# Patient Record
Sex: Female | Born: 1970 | Race: Black or African American | Hispanic: No | Smoking: Former smoker
Health system: Southern US, Community
[De-identification: ages and names within clinical notes are randomized; demographics above are authoritative.]

## PROBLEM LIST (undated history)

## (undated) DIAGNOSIS — E119 Type 2 diabetes mellitus without complications: Secondary | ICD-10-CM

## (undated) DIAGNOSIS — I10 Essential (primary) hypertension: Secondary | ICD-10-CM

## (undated) HISTORY — DX: Type 2 diabetes mellitus without complications: E11.9

## (undated) HISTORY — PX: TUBAL LIGATION: SHX77

## (undated) HISTORY — DX: Essential (primary) hypertension: I10

## (undated) HISTORY — PX: CHOLECYSTECTOMY: SHX55

---

## 2001-12-12 ENCOUNTER — Other Ambulatory Visit: Admission: RE | Admit: 2001-12-12 | Discharge: 2001-12-12 | Payer: Self-pay | Admitting: *Deleted

## 2001-12-23 ENCOUNTER — Encounter: Payer: Self-pay | Admitting: *Deleted

## 2001-12-23 ENCOUNTER — Ambulatory Visit (HOSPITAL_COMMUNITY): Admission: RE | Admit: 2001-12-23 | Discharge: 2001-12-23 | Payer: Self-pay | Admitting: *Deleted

## 2002-01-16 ENCOUNTER — Inpatient Hospital Stay (HOSPITAL_COMMUNITY): Admission: AD | Admit: 2002-01-16 | Discharge: 2002-01-18 | Payer: Self-pay | Admitting: *Deleted

## 2015-10-19 ENCOUNTER — Emergency Department (HOSPITAL_BASED_OUTPATIENT_CLINIC_OR_DEPARTMENT_OTHER)
Admission: EM | Admit: 2015-10-19 | Discharge: 2015-10-19 | Discharge status: Against Medical Advice | Payer: Medicaid Other | Attending: Emergency Medicine | Admitting: Emergency Medicine

## 2015-10-19 ENCOUNTER — Emergency Department (HOSPITAL_BASED_OUTPATIENT_CLINIC_OR_DEPARTMENT_OTHER): Payer: Medicaid Other

## 2015-10-19 ENCOUNTER — Encounter (HOSPITAL_BASED_OUTPATIENT_CLINIC_OR_DEPARTMENT_OTHER): Payer: Self-pay | Admitting: *Deleted

## 2015-10-19 DIAGNOSIS — F17210 Nicotine dependence, cigarettes, uncomplicated: Secondary | ICD-10-CM | POA: Insufficient documentation

## 2015-10-19 DIAGNOSIS — R05 Cough: Secondary | ICD-10-CM | POA: Insufficient documentation

## 2015-10-19 LAB — RAPID STREP SCREEN (MED CTR MEBANE ONLY): Streptococcus, Group A Screen (Direct): NEGATIVE

## 2015-10-19 NOTE — ED Notes (Signed)
Cough x 3 days

## 2015-10-20 ENCOUNTER — Emergency Department (HOSPITAL_BASED_OUTPATIENT_CLINIC_OR_DEPARTMENT_OTHER)
Admission: EM | Admit: 2015-10-20 | Discharge: 2015-10-20 | Disposition: A | Discharge status: Home / Self Care | Payer: Medicaid Other | Attending: Emergency Medicine | Admitting: Emergency Medicine

## 2015-10-20 ENCOUNTER — Encounter (HOSPITAL_BASED_OUTPATIENT_CLINIC_OR_DEPARTMENT_OTHER): Payer: Self-pay | Admitting: Emergency Medicine

## 2015-10-20 DIAGNOSIS — J209 Acute bronchitis, unspecified: Secondary | ICD-10-CM | POA: Insufficient documentation

## 2015-10-20 DIAGNOSIS — F17210 Nicotine dependence, cigarettes, uncomplicated: Secondary | ICD-10-CM | POA: Insufficient documentation

## 2015-10-20 DIAGNOSIS — J40 Bronchitis, not specified as acute or chronic: Secondary | ICD-10-CM

## 2015-10-20 DIAGNOSIS — R51 Headache: Secondary | ICD-10-CM | POA: Insufficient documentation

## 2015-10-20 MED ORDER — ALBUTEROL SULFATE HFA 108 (90 BASE) MCG/ACT IN AERS
1.0000 | INHALATION_SPRAY | RESPIRATORY_TRACT | Status: DC | PRN
  Administered 2015-10-20: 2 via RESPIRATORY_TRACT
  Filled 2015-10-20: qty 6.7

## 2015-10-20 MED ORDER — AZITHROMYCIN 250 MG PO TABS: ORAL_TABLET | ORAL | Status: DC

## 2015-10-20 MED ORDER — PREDNISONE 10 MG PO TABS: 20.0000 mg | ORAL_TABLET | Freq: Two times a day (BID) | ORAL | Status: DC

## 2015-10-20 MED ORDER — BENZONATATE 200 MG PO CAPS: 200.0000 mg | ORAL_CAPSULE | Freq: Three times a day (TID) | ORAL | Status: DC | PRN

## 2015-10-20 MED FILL — BENZONATATE 200 MG CAPSULE: 200 | 6 days supply | Qty: 20 | Fill #0

## 2015-10-20 MED FILL — predniSONE 10 MG TABS: 10 | 4 days supply | Qty: 16 | Fill #0

## 2015-10-20 MED FILL — AZITHROMYCIN 250 MG TABLET: 250 | 5 days supply | Qty: 6 | Fill #0

## 2015-10-20 NOTE — ED Notes (Signed)
RT gave patient MDI to take home. She stated she had taken them before and had no questions. She said she did not need it this moment but would need it later.

## 2015-10-20 NOTE — ED Notes (Signed)
Cough x 2-3 days

## 2015-10-20 NOTE — Discharge Instructions (Signed)
Follow up with your doctor to have your blood pressure rechecked. It was elevated today 145/108

## 2015-10-20 NOTE — ED Provider Notes (Signed)
CSN: [id number]     Arrival date & time 10/20/15  1008 History   First MD Initiated Contact with Patient 10/20/15 1221     Chief Complaint  Patient presents with  . Cough     (Consider location/radiation/quality/duration/timing/severity/associated sxs/prior Treatment) Patient is a 45 y.o. female presenting with cough.  Cough Cough characteristics:  Productive Sputum characteristics:  Yellow Severity:  Moderate Onset quality:  Gradual Duration:  3 days Timing:  Intermittent Progression:  Worsening Chronicity:  New Smoker: yes   Context: sick contacts   Relieved by:  Nothing Worsened by:  Lying down and smoking Ineffective treatments:  Fluids and cough suppressants Associated symptoms: chills, fever, headaches, myalgias and sinus congestion   Associated symptoms: no ear pain and no rash    Allison Poole is a 45 y.o. female who presents to the ED with cough, sore throat and congestion that started 3 days ago. She reports that her son was diagnosed with bronchitis and then was called that his strep screen was positive. Patient also reports that parents have been sick.   History reviewed. No pertinent past medical history. Past Surgical History  Procedure Laterality Date  . Cholecystectomy     History reviewed. No pertinent family history. Social History  Substance Use Topics  . Smoking status: Current Every Day Smoker -- 0.50 packs/day    Types: Cigarettes  . Smokeless tobacco: None  . Alcohol Use: Yes   OB History    No data available     Review of Systems  Constitutional: Positive for fever and chills.  HENT: Positive for congestion and sinus pressure. Negative for ear pain and mouth sores.   Eyes: Negative for pain, redness and itching.  Respiratory: Positive for cough.   Gastrointestinal: Negative for nausea, vomiting and abdominal pain.  Genitourinary: Negative for dysuria, urgency and hematuria.  Musculoskeletal: Positive for myalgias. Negative for back pain  and neck stiffness.  Skin: Negative for rash.  Neurological: Positive for headaches. Negative for dizziness and syncope.      Allergies  Review of patient's allergies indicates no known allergies.  Home Medications   Prior to Admission medications   Medication Sig Start Date End Date Taking? Authorizing Provider  azithromycin (ZITHROMAX Z-PAK) 250 MG tablet Take 2 tablets today PO then one tablet daily 10/20/15   Hope Orlene Och, NP  benzonatate (TESSALON) 200 MG capsule Take 1 capsule (200 mg total) by mouth 3 (three) times daily as needed for cough. 10/20/15   Hope Orlene Och, NP  predniSONE (DELTASONE) 10 MG tablet Take 2 tablets (20 mg total) by mouth 2 (two) times daily with a meal. 10/20/15   Hope Orlene Och, NP   BP 145/108 mmHg  Pulse 93  Temp(Src) 98.6 F (37 C) (Oral)  Resp 18  Wt 113.399 kg  SpO2 100%  LMP 10/12/2015 Physical Exam  Constitutional: She is oriented to person, place, and time. She appears well-developed and well-nourished. She appears distressed.  HENT:  Head: Normocephalic.  Right Ear: Tympanic membrane normal.  Left Ear: Tympanic membrane normal.  Nose: Rhinorrhea present.  Mouth/Throat: Uvula is midline and mucous membranes are normal. Posterior oropharyngeal erythema present.  Eyes: Conjunctivae and EOM are normal. Pupils are equal, round, and reactive to light.  Neck: Normal range of motion. Neck supple.  Cardiovascular: Normal rate and regular rhythm.   Elevated BP  Pulmonary/Chest: Effort normal. No respiratory distress. Wheezes: occasional.  Abdominal: She exhibits no distension.  Musculoskeletal: Normal range of motion.  Neurological: She  is alert and oriented to person, place, and time. No cranial nerve deficit.  Skin: Skin is warm and dry.  Psychiatric: She has a normal mood and affect. Her behavior is normal.  Nursing note and vitals reviewed.   ED Course  Procedures (including critical care time) Labs Review Labs Reviewed - No data to  display  Imaging Review Dg Chest 2 View  10/19/2015  CLINICAL DATA:  Cough for 2 months with worsening last 2 days EXAM: CHEST  2 VIEW COMPARISON:  None. FINDINGS: Cardiomediastinal silhouette is unremarkable. No acute infiltrate or pleural effusion. No pulmonary edema. Bony thorax is unremarkable. IMPRESSION: No active cardiopulmonary disease. Electronically Signed   By: Natasha MeadLiviu  Pop M.D.   On: 10/19/2015 19:56     MDM  45 y.o. female with productive cough and congestion x3 days stable for d/c without respiratory distress, O2 SAT 100% on R/A. Will treat for bronchitis and encouraged patient to stop smoking. Albuterol inhaler with instructions given to patient prior to d/c. Will start short steroid burst and cough medication in addition to Z-Pak. Patient to follow up with her PCP or return for worsening symptoms.   Final diagnoses:  Bronchitis       Janne NapoleonHope M Neese, NP 10/20/15 1343  Lavera Guiseana Duo Liu, MD 10/20/15 2016

## 2015-10-22 LAB — CULTURE, GROUP A STREP: Strep A Culture: NEGATIVE

## 2017-01-31 ENCOUNTER — Encounter (HOSPITAL_BASED_OUTPATIENT_CLINIC_OR_DEPARTMENT_OTHER): Payer: Self-pay | Admitting: Emergency Medicine

## 2017-01-31 ENCOUNTER — Emergency Department (HOSPITAL_BASED_OUTPATIENT_CLINIC_OR_DEPARTMENT_OTHER)
Admission: EM | Admit: 2017-01-31 | Discharge: 2017-01-31 | Disposition: A | Discharge status: Home / Self Care | Payer: Medicaid Other | Attending: Emergency Medicine | Admitting: Emergency Medicine

## 2017-01-31 DIAGNOSIS — F17210 Nicotine dependence, cigarettes, uncomplicated: Secondary | ICD-10-CM | POA: Insufficient documentation

## 2017-01-31 DIAGNOSIS — B9789 Other viral agents as the cause of diseases classified elsewhere: Secondary | ICD-10-CM

## 2017-01-31 DIAGNOSIS — J069 Acute upper respiratory infection, unspecified: Secondary | ICD-10-CM | POA: Diagnosis not present

## 2017-01-31 DIAGNOSIS — R05 Cough: Secondary | ICD-10-CM | POA: Diagnosis present

## 2017-01-31 MED ORDER — BENZONATATE 200 MG PO CAPS: 200.0000 mg | ORAL_CAPSULE | Freq: Three times a day (TID) | ORAL | 0 refills | Status: DC | PRN

## 2017-01-31 MED ORDER — LORATADINE 10 MG PO TABS: 10.0000 mg | ORAL_TABLET | Freq: Every day | ORAL | 0 refills | Status: DC

## 2017-01-31 MED ORDER — FLUTICASONE PROPIONATE 50 MCG/ACT NA SUSP: 1.0000 | Freq: Every day | NASAL | 2 refills | Status: DC

## 2017-01-31 MED FILL — FLUTICASONE PROP 50 MCG SPR: 50 | 30 days supply | Qty: 16 | Fill #0

## 2017-01-31 MED FILL — LORATADINE 10 MG TABLET: 10 | 100 days supply | Qty: 100 | Fill #0

## 2017-01-31 MED FILL — BENZONATATE 200 MG CAP: 200 | 6 days supply | Qty: 20 | Fill #0

## 2017-01-31 NOTE — ED Provider Notes (Signed)
[facility]   CSN: [id number] Arrival date & time: 01/31/17  8295     History   Chief Complaint Chief Complaint  Patient presents with  . Cough    HPI Allison Poole is a 46 y.o. female.  HPI  46 year old female presents today with upper respiratory congestion.  Patient reports symptoms started 3 days ago with sinus pressure, rhinorrhea, congestion and minimally active cough.  Patient denies any fevers at home, denies any nausea or vomiting, or shortness of breath.  Patient reports a history of regional allergies not currently taking any medications.    History reviewed. No pertinent past medical history.  There are no active problems to display for this patient.   Past Surgical History:  Procedure Laterality Date  . CHOLECYSTECTOMY    . TUBAL LIGATION      OB History    No data available      Home Medications    Prior to Admission medications   Medication Sig Start Date End Date Taking? Authorizing Provider  azithromycin (ZITHROMAX Z-PAK) 250 MG tablet Take 2 tablets today PO then one tablet daily 10/20/15   Hope Orlene Och, NP  benzonatate (TESSALON) 200 MG capsule Take 1 capsule (200 mg total) by mouth 3 (three) times daily as needed for cough. 01/31/17   Eyvonne Mechanic, PA-C  fluticasone (FLONASE) 50 MCG/ACT nasal spray Place 1 spray into both nostrils daily. 01/31/17   Eyvonne Mechanic, PA-C  loratadine (CLARITIN) 10 MG tablet Take 1 tablet (10 mg total) by mouth daily. 01/31/17   Eyvonne Mechanic, PA-C  predniSONE (DELTASONE) 10 MG tablet Take 2 tablets (20 mg total) by mouth 2 (two) times daily with a meal. 10/20/15   Hope Orlene Och, NP    Family History No family history on file.  Social History Social History  Substance Use Topics  . Smoking status: Current Every Day Smoker    Packs/day: 0.50    Types: E-cigarettes  . Smokeless tobacco: Never Used  . Alcohol use Yes     Allergies   Patient has no known allergies.   Review of  Systems Review of Systems  Constitutional: Negative for fever.  HENT: Positive for congestion, postnasal drip and sinus pressure. Negative for ear discharge, ear pain, nosebleeds and trouble swallowing.   Respiratory: Positive for cough. Negative for chest tightness and shortness of breath.   All other systems reviewed and are negative.    Physical Exam Updated Vital Signs BP (!) 144/95 (BP Location: Left Arm)   Pulse 72   Temp 97.9 F (36.6 C) (Oral)   Resp 16   Ht  (1.6 m)   Wt 122.5 kg   LMP 01/25/2017   SpO2 99%   BMI 47.83 kg/m   Physical Exam  Constitutional: She is oriented to person, place, and time. She appears well-developed and well-nourished.  HENT:  Head: Normocephalic and atraumatic.  Right Ear: Tympanic membrane normal.  Nose: Mucosal edema and rhinorrhea present. No epistaxis.  Left cerumen impaction  Eyes: Conjunctivae are normal. Pupils are equal, round, and reactive to light. Right eye exhibits no discharge. Left eye exhibits no discharge. No scleral icterus.  Neck: Normal range of motion. No JVD present. No tracheal deviation present.  Pulmonary/Chest: Effort normal. No stridor. No respiratory distress. She has no wheezes. She has no rales.  Neurological: She is alert and oriented to person, place, and time. Coordination normal.  Psychiatric: She has a normal mood and affect. Her behavior is  normal. Judgment and thought content normal.  Nursing note and vitals reviewed.    ED Treatments / Results  Labs (all labs ordered are listed, but only abnormal results are displayed) Labs Reviewed - No data to display  EKG  EKG Interpretation None       Radiology No results found.  Procedures Procedures (including critical care time)  Medications Ordered in ED Medications - No data to display   Initial Impression / Assessment and Plan / ED Course  I have reviewed the triage vital signs and the nursing notes.  Pertinent labs & imaging  results that were available during my care of the patient were reviewed by me and considered in my medical decision making (see chart for details).      Final Clinical Impressions(s) / ED Diagnoses   Final diagnoses:  Viral URI with cough     Discharge Meds: Flonase, Tessalon, Claritin  Assessment/Plan: 89 YOF presents today with likely viral URI.  Patient is afebrile nontoxic, clear lung sounds.  I have very low suspicion for significant bacterial infection.  Patient symptoms more pronounced in the sinuses.  No concerning features for bacterial sinusitis.  Patient will be started on above medications, encouraged follow-up with primary care return immediately if any new or worsening signs or symptoms present.  She verbalized understanding and agreement to today's plan.     New Prescriptions New Prescriptions   FLUTICASONE (FLONASE) 50 MCG/ACT NASAL SPRAY    Place 1 spray into both nostrils daily.   LORATADINE (CLARITIN) 10 MG TABLET    Take 1 tablet (10 mg total) by mouth daily.     Eyvonne Mechanic, PA-C 01/31/17 1021    Melene Plan, DO 01/31/17 1119

## 2017-01-31 NOTE — ED Triage Notes (Signed)
Cough, congestion, sore throat since Monday. OTC meds not working. Pt reports brown sputum.

## 2017-01-31 NOTE — Discharge Instructions (Signed)
Please read attached information. If you experience any new or worsening signs or symptoms please return to the emergency room for evaluation. Please follow-up with your primary care provider or specialist as discussed. Please use medication prescribed only as directed and discontinue taking if you have any concerning signs or symptoms.   °

## 2017-03-07 ENCOUNTER — Emergency Department (HOSPITAL_BASED_OUTPATIENT_CLINIC_OR_DEPARTMENT_OTHER)
Admission: EM | Admit: 2017-03-07 | Discharge: 2017-03-07 | Disposition: A | Discharge status: Home / Self Care | Payer: Medicaid Other | Attending: Emergency Medicine | Admitting: Emergency Medicine

## 2017-03-07 ENCOUNTER — Encounter (HOSPITAL_BASED_OUTPATIENT_CLINIC_OR_DEPARTMENT_OTHER): Payer: Self-pay

## 2017-03-07 DIAGNOSIS — M545 Low back pain, unspecified: Secondary | ICD-10-CM

## 2017-03-07 DIAGNOSIS — F17210 Nicotine dependence, cigarettes, uncomplicated: Secondary | ICD-10-CM | POA: Diagnosis not present

## 2017-03-07 LAB — URINALYSIS, ROUTINE W REFLEX MICROSCOPIC
BILIRUBIN URINE: NEGATIVE
GLUCOSE, UA: NEGATIVE mg/dL
HGB URINE DIPSTICK: NEGATIVE
KETONES UR: NEGATIVE mg/dL
Leukocytes, UA: NEGATIVE
NITRITE: NEGATIVE
PH: 7 (ref 5.0–8.0)
Protein, ur: NEGATIVE mg/dL
SPECIFIC GRAVITY, URINE: 1.018 (ref 1.005–1.030)

## 2017-03-07 LAB — PREGNANCY, URINE: Preg Test, Ur: NEGATIVE

## 2017-03-07 MED ORDER — NAPROXEN 500 MG PO TABS: 500.0000 mg | ORAL_TABLET | Freq: Two times a day (BID) | ORAL | 0 refills | Status: DC

## 2017-03-07 MED ORDER — METHOCARBAMOL 500 MG PO TABS: 1000.0000 mg | ORAL_TABLET | Freq: Four times a day (QID) | ORAL | 0 refills | Status: DC

## 2017-03-07 MED FILL — NAPROXEN 500 MG TABLET: 500 | 10 days supply | Qty: 20 | Fill #0

## 2017-03-07 MED FILL — METHOCARBAMOL 500 MG TABLET: 500 | 3 days supply | Qty: 20 | Fill #0

## 2017-03-07 NOTE — ED Notes (Signed)
Pt given note for work. Directed to pharmacy to pick up Rx.

## 2017-03-07 NOTE — ED Triage Notes (Signed)
C/o lower back pain x 2 days-denies injury-also c/o bilat LE swelling x 2 weeks-NAD-slow steady gait

## 2017-03-07 NOTE — ED Provider Notes (Signed)
[facility]   CSN: [id number] Arrival date & time: 03/07/17  1140     History   Chief Complaint Chief Complaint  Patient presents with  . Back Pain    HPI Allison Poole is a 46 y.o. female.  Patient complains of bilateral lower back pain starting 2 days ago. No acute injuries. Pain is worse with movement and palpation. Patient states that she has been exercising but has not done so in the past 1 week. She took a friend's muscle relaxer yesterday with some relief. She has also taken ibuprofen. No radiation of pain into her legs. No fevers, abdominal pain, urinary symptoms, vaginal bleeding or discharge. Patient denies warning symptoms of back pain including: fecal incontinence, urinary retention or overflow incontinence, night sweats, waking from sleep with back pain, unexplained fevers or weight loss, h/o cancer, IVDU, recent trauma.    Patient also notes bilateral lower extremity swelling for the past 2 weeks. She works sitting. Swelling is worse at the end of the day and better in the mornings after she elevates them. No shortness of breath.      History reviewed. No pertinent past medical history.  There are no active problems to display for this patient.   Past Surgical History:  Procedure Laterality Date  . CHOLECYSTECTOMY    . TUBAL LIGATION      OB History    No data available       Home Medications    Prior to Admission medications   Medication Sig Start Date End Date Taking? Authorizing Provider  methocarbamol (ROBAXIN) 500 MG tablet Take 2 tablets (1,000 mg total) by mouth 4 (four) times daily. 03/07/17   Renne Crigler, PA-C  naproxen (NAPROSYN) 500 MG tablet Take 1 tablet (500 mg total) by mouth 2 (two) times daily. 03/07/17   Renne Crigler, PA-C    Family History No family history on file.  Social History Social History  Substance Use Topics  . Smoking status: Current Every Day Smoker    Packs/day: 0.50    Types:  E-cigarettes  . Smokeless tobacco: Never Used  . Alcohol use Yes     Comment: weekly     Allergies   Patient has no known allergies.   Review of Systems Review of Systems  Constitutional: Negative for fever and unexpected weight change.  Cardiovascular: Negative for leg swelling.  Gastrointestinal: Negative for constipation.       Negative for fecal incontinence.   Genitourinary: Negative for dysuria, flank pain, hematuria, pelvic pain, vaginal bleeding and vaginal discharge.       Negative for urinary incontinence or retention.  Musculoskeletal: Positive for back pain.  Neurological: Negative for weakness and numbness.       Denies saddle paresthesias.     Physical Exam Updated Vital Signs BP (!) 144/93 (BP Location: Left Arm)   Pulse 94   Temp 99.2 F (37.3 C) (Oral)   Resp 18   Ht 5\' 3"  (1.6 m)   Wt 127 kg (280 lb)   LMP 02/22/2017   SpO2 98%   BMI 49.60 kg/m   Physical Exam  Constitutional: She appears well-developed and well-nourished.  HENT:  Head: Normocephalic and atraumatic.  Eyes: Conjunctivae are normal.  Neck: Normal range of motion. Neck supple.  Pulmonary/Chest: Effort normal.  Abdominal: Soft. There is no tenderness. There is no CVA tenderness.  Musculoskeletal: Normal range of motion.       Cervical back: She exhibits normal range of motion,  no tenderness and no bony tenderness.       Thoracic back: She exhibits normal range of motion, no tenderness and no bony tenderness.       Lumbar back: She exhibits tenderness.       Back:  No step-off noted with palpation of spine.   Neurological: She is alert. She has normal strength and normal reflexes. No sensory deficit.  5/5 strength in entire lower extremities bilaterally. No sensation deficit. Patient ambulatory without foot drop.  Skin: Skin is warm and dry. No rash noted.  Trace bilateral edema of feet and ankles.  Psychiatric: She has a normal mood and affect.  Nursing note and vitals  reviewed.    ED Treatments / Results  Labs (all labs ordered are listed, but only abnormal results are displayed) Labs Reviewed  URINALYSIS, ROUTINE W REFLEX MICROSCOPIC  PREGNANCY, URINE    Procedures Procedures (including critical care time)  Medications Ordered in ED Medications - No data to display   Initial Impression / Assessment and Plan / ED Course  I have reviewed the triage vital signs and the nursing notes.  Pertinent labs & imaging results that were available during my care of the patient were reviewed by me and considered in my medical decision making (see chart for details).     1:02 PM Patient seen and examined. Informed of negative urine test results.  Vital signs reviewed and are as follows: Vitals:   03/07/17 1150  BP: (!) 144/93  Pulse: 94  Resp: 18  Temp: 99.2 F (37.3 C)    No red flag s/s of low back pain. Patient was counseled on back pain precautions and told to do activity as tolerated but do not lift, push, or pull heavy objects more than 10 pounds for the next week. Patient counseled to use ice or heat on back for no longer than 15 minutes every hour.   Patient counseled on proper use of muscle relaxant medication. They were told not to drink alcohol, drive any vehicle, or do any dangerous activities while taking this medication. Patient verbalized understanding.  Patient urged to follow-up with PCP if pain does not improve with treatment and rest or if pain becomes recurrent. Urged to return with worsening severe pain, loss of bowel or bladder control, trouble walking.   The patient verbalizes understanding and agrees with the plan.    Final Clinical Impressions(s) / ED Diagnoses   Final diagnoses:  Acute bilateral low back pain without sciatica   Patient with back pain, Worse with movement and palpation. No neurological deficits. Patient is ambulatory. No warning symptoms of back pain including: fecal incontinence, urinary retention or  overflow incontinence, night sweats, waking from sleep with back pain, unexplained fevers or weight loss, h/o cancer, IVDU, recent trauma. No concern for cauda equina, epidural abscess, or other serious cause of back pain. Conservative measures such as rest, ice/heat and pain medicine indicated with PCP follow-up if no improvement with conservative management.    New Prescriptions New Prescriptions   METHOCARBAMOL (ROBAXIN) 500 MG TABLET    Take 2 tablets (1,000 mg total) by mouth 4 (four) times daily.   NAPROXEN (NAPROSYN) 500 MG TABLET    Take 1 tablet (500 mg total) by mouth 2 (two) times daily.     Renne CriglerGeiple, Akanksha Bellmore, PA-C 03/07/17 1303    Renne CriglerGeiple, Argenis Kumari, PA-C 03/07/17 1304    Gwyneth SproutPlunkett, Whitney, MD 03/07/17 1406

## 2017-03-07 NOTE — Discharge Instructions (Signed)
Please read and follow all provided instructions.  Your diagnoses today include:  1. Acute bilateral low back pain without sciatica     Tests performed today include:  Vital signs - see below for your results today  Urine test - no infection  Medications prescribed:   Robaxin (methocarbamol) - muscle relaxer medication  DO NOT drive or perform any activities that require you to be awake and alert because this medicine can make you drowsy.    Naproxen - anti-inflammatory pain medication  Do not exceed 500mg  naproxen every 12 hours, take with food  You have been prescribed an anti-inflammatory medication or NSAID. Take with food. Take smallest effective dose for the shortest duration needed for your pain. Stop taking if you experience stomach pain or vomiting.   Take any prescribed medications only as directed.  Home care instructions:   Follow any educational materials contained in this packet  Please rest, use ice or heat on your back for the next several days  Do not lift, push, pull anything more than 10 pounds for the next week  Follow-up instructions: Please follow-up with your primary care provider in the next 1 week for further evaluation of your symptoms.   Return instructions:  SEEK IMMEDIATE MEDICAL ATTENTION IF YOU HAVE:  New numbness, tingling, weakness, or problem with the use of your arms or legs  Severe back pain not relieved with medications  Loss control of your bowels or bladder  Increasing pain in any areas of the body (such as chest or abdominal pain)  Shortness of breath, dizziness, or fainting.   Worsening nausea (feeling sick to your stomach), vomiting, fever, or sweats  Any other emergent concerns regarding your health   Additional Information:  Your vital signs today were: BP (!) 144/93 (BP Location: Left Arm)    Pulse 94    Temp 99.2 F (37.3 C) (Oral)    Resp 18    Ht 5\' 3"  (1.6 m)    Wt 127 kg (280 lb)    LMP 02/22/2017    SpO2 98%     BMI 49.60 kg/m  If your blood pressure (BP) was elevated above 135/85 this visit, please have this repeated by your doctor within one month. --------------

## 2019-06-19 ENCOUNTER — Emergency Department (HOSPITAL_BASED_OUTPATIENT_CLINIC_OR_DEPARTMENT_OTHER)
Admission: EM | Admit: 2019-06-19 | Discharge: 2019-06-19 | Disposition: A | Discharge status: Home / Self Care | Payer: Self-pay

## 2022-01-04 ENCOUNTER — Emergency Department (HOSPITAL_BASED_OUTPATIENT_CLINIC_OR_DEPARTMENT_OTHER)
Admission: EM | Admit: 2022-01-04 | Discharge: 2022-01-04 | Disposition: A | Discharge status: Home / Self Care | Payer: Medicaid Other | Attending: Emergency Medicine | Admitting: Emergency Medicine

## 2022-01-04 ENCOUNTER — Encounter (HOSPITAL_BASED_OUTPATIENT_CLINIC_OR_DEPARTMENT_OTHER): Payer: Self-pay

## 2022-01-04 ENCOUNTER — Other Ambulatory Visit: Payer: Self-pay

## 2022-01-04 DIAGNOSIS — Z20822 Contact with and (suspected) exposure to covid-19: Secondary | ICD-10-CM | POA: Insufficient documentation

## 2022-01-04 DIAGNOSIS — J029 Acute pharyngitis, unspecified: Secondary | ICD-10-CM

## 2022-01-04 DIAGNOSIS — J069 Acute upper respiratory infection, unspecified: Secondary | ICD-10-CM | POA: Insufficient documentation

## 2022-01-04 DIAGNOSIS — B9789 Other viral agents as the cause of diseases classified elsewhere: Secondary | ICD-10-CM | POA: Insufficient documentation

## 2022-01-04 LAB — RESP PANEL BY RT-PCR (FLU A&B, COVID) ARPGX2
Influenza A by PCR: NEGATIVE
Influenza B by PCR: NEGATIVE
SARS Coronavirus 2 by RT PCR: NEGATIVE

## 2022-01-04 LAB — GROUP A STREP BY PCR: Group A Strep by PCR: NOT DETECTED

## 2022-01-04 MED ORDER — LIDOCAINE VISCOUS HCL 2 % MT SOLN: 15.0000 mL | OROMUCOSAL | 0 refills | Status: DC | PRN

## 2022-01-04 NOTE — ED Triage Notes (Signed)
Pt c/o flu like sx x 4-5 days-NAD-steady gait 

## 2022-01-04 NOTE — ED Provider Notes (Signed)
?[facility] EMERGENCY DEPARTMENT ?Provider Note ? ? ?CSN: [id number] ?Arrival date & time: 01/04/22  1739 ? ?  ? ?History ? ?Chief Complaint  ?Patient presents with  ? Cough  ? ? ?Allison Poole is a 51 y.o. female presenting to the ED with 4-day history of sore throat, congestion and slight cough.  She has been taking Tylenol with some improvement in her symptoms.  Last use was this morning.  Denies any sick contacts with similar symptoms.  Denies any chest pain, shortness of breath, trismus or drooling. ? ? ?Cough ?Associated symptoms: sore throat   ?Associated symptoms: no chills and no fever   ? ?  ? ?Home Medications ?Prior to Admission medications   ?Medication Sig Start Date End Date Taking? Authorizing Provider  ?lidocaine (XYLOCAINE) 2 % solution Use as directed 15 mLs in the mouth or throat as needed for mouth pain. 01/04/22  Yes Jayston Trevino, PA-C  ?methocarbamol (ROBAXIN) 500 MG tablet Take 2 tablets (1,000 mg total) by mouth 4 (four) times daily. 03/07/17   Renne Crigler, PA-C  ?naproxen (NAPROSYN) 500 MG tablet Take 1 tablet (500 mg total) by mouth 2 (two) times daily. 03/07/17   Renne Crigler, PA-C  ?   ? ?Allergies    ?Patient has no known allergies.   ? ?Review of Systems   ?Review of Systems  ?Constitutional:  Negative for chills and fever.  ?HENT:  Positive for congestion and sore throat.   ?Respiratory:  Positive for cough.   ? ?Physical Exam ?Updated Vital Signs ?BP (!) 178/91 (BP Location: Right Arm)   Pulse 72   Temp 99 ?F (37.2 ?C) (Oral)   Resp 17   Ht 5\' 3"  (1.6 m)   Wt 124.7 kg   LMP 01/01/2022   SpO2 100%   BMI 48.71 kg/m?  ?Physical Exam ?Vitals and nursing note reviewed.  ?Constitutional:   ?   General: She is not in acute distress. ?   Appearance: She is well-developed. She is not diaphoretic.  ?HENT:  ?   Head: Normocephalic and atraumatic.  ?   Mouth/Throat:  ?   Pharynx: Uvula midline. Posterior oropharyngeal erythema present.  ?   Tonsils: 1+ on the right. 1+ on the  left.  ?   Comments: Bilaterally, symmetrically enlarged tonsils without exudates. Patient does not appear to be in acute distress. No trismus or drooling present. No pooling of secretions. Patient is tolerating secretions and is not in respiratory distress. No neck pain or tenderness to palpation of the neck. Full active and passive range of motion of the neck. No evidence of RPA or PTA. ?Eyes:  ?   General: No scleral icterus. ?   Conjunctiva/sclera: Conjunctivae normal.  ?Cardiovascular:  ?   Rate and Rhythm: Normal rate and regular rhythm.  ?   Heart sounds: Normal heart sounds.  ?Pulmonary:  ?   Effort: Pulmonary effort is normal. No respiratory distress.  ?   Breath sounds: Normal breath sounds. No wheezing.  ?Musculoskeletal:  ?   Cervical back: Normal range of motion.  ?Skin: ?   Findings: No rash.  ?Neurological:  ?   Mental Status: She is alert.  ? ? ?ED Results / Procedures / Treatments   ?Labs ?(all labs ordered are listed, but only abnormal results are displayed) ?Labs Reviewed  ?RESP PANEL BY RT-PCR (FLU A&B, COVID) [id number]  ?GROUP A STREP BY PCR  ? ? ?EKG ?None ? ?Radiology ?No results found. ? ?Procedures ?Procedures  ? ? ?  Medications Ordered in ED ?Medications - No data to display ? ?ED Course/ Medical Decision Making/ A&P ?Clinical Course as of 01/04/22 1912  ?Wed Jan 04, 2022  ?1830 Resp Panel by RT-PCR (Flu A&B, Covid) Nasopharyngeal Swab ?Negative. [HK]  ?  ?Clinical Course User Index ?[HK] Idelle Leech, Burak Zerbe, PA-C  ? ?                        ?Medical Decision Making ?Amount and/or Complexity of Data Reviewed ?Labs:  Decision-making details documented in ED Course. ? ? ?Pt rapid strep test negative. Pt is tolerating secretions, not in respiratory distress, no neck pain, no trismus. Presentation not concerning for peritonsillar abscess or spread of infection to deep spaces of the throat; patent airway. Pt will be discharged with lidocaine to swish and spit. Ibuprofen or Tylenol as needed for pain/fever.  Specific return precautions discussed. Recommended PCP follow up. Pt appears safe for discharge.  ? ? ?Patient is hemodynamically stable, in NAD, and able to ambulate in the ED. Evaluation does not show pathology that would require ongoing emergent intervention or inpatient treatment. I explained the diagnosis to the patient. Pain has been managed and has no complaints prior to discharge. Patient is comfortable with above plan and is stable for discharge at this time. All questions were answered prior to disposition. Strict return precautions for returning to the ED were discussed. Encouraged follow up with PCP.  ? ?An After Visit Summary was printed and given to the patient. ? ? ?Portions of this note were generated with Scientist, clinical (histocompatibility and immunogenetics). Dictation errors may occur despite best attempts at proofreading. ? ? ? ? ? ? ? ? ?Final Clinical Impression(s) / ED Diagnoses ?Final diagnoses:  ?Viral URI with cough  ?Viral pharyngitis  ? ? ?Rx / DC Orders ?ED Discharge Orders   ? ?      Ordered  ?  lidocaine (XYLOCAINE) 2 % solution  As needed       ? 01/04/22 1910  ? ?  ?  ? ?  ? ? ?  Dietrich Pates, PA-C ?01/04/22 1912 ? ?  ?Melene Plan, DO ?01/04/22 1915 ? ?

## 2022-01-04 NOTE — Discharge Instructions (Addendum)
Your strep test was negative today. ?You can swish and spit lidocaine to help with throat discomfort. ?Follow-up with your primary care provider or the 1 listed below. ?Return to the ER if you start to experience worsening symptoms, trouble breathing, trouble swallowing, chest pain. ?

## 2022-01-09 ENCOUNTER — Other Ambulatory Visit: Payer: Self-pay

## 2022-01-09 ENCOUNTER — Encounter (HOSPITAL_BASED_OUTPATIENT_CLINIC_OR_DEPARTMENT_OTHER): Payer: Self-pay

## 2022-01-09 ENCOUNTER — Emergency Department (HOSPITAL_BASED_OUTPATIENT_CLINIC_OR_DEPARTMENT_OTHER): Payer: Self-pay

## 2022-01-09 ENCOUNTER — Inpatient Hospital Stay (HOSPITAL_BASED_OUTPATIENT_CLINIC_OR_DEPARTMENT_OTHER)
Admission: EM | Admit: 2022-01-09 | Discharge: 2022-01-12 | DRG: 153 | Disposition: A | Discharge status: Home / Self Care | Payer: Self-pay | Attending: Family Medicine | Admitting: Family Medicine

## 2022-01-09 DIAGNOSIS — J0430 Supraglottitis, unspecified, without obstruction: Principal | ICD-10-CM | POA: Diagnosis present

## 2022-01-09 DIAGNOSIS — Z8249 Family history of ischemic heart disease and other diseases of the circulatory system: Secondary | ICD-10-CM

## 2022-01-09 DIAGNOSIS — F17210 Nicotine dependence, cigarettes, uncomplicated: Secondary | ICD-10-CM | POA: Diagnosis present

## 2022-01-09 DIAGNOSIS — E1165 Type 2 diabetes mellitus with hyperglycemia: Secondary | ICD-10-CM | POA: Diagnosis present

## 2022-01-09 DIAGNOSIS — R739 Hyperglycemia, unspecified: Secondary | ICD-10-CM | POA: Diagnosis present

## 2022-01-09 DIAGNOSIS — F17200 Nicotine dependence, unspecified, uncomplicated: Secondary | ICD-10-CM | POA: Diagnosis present

## 2022-01-09 DIAGNOSIS — Z79899 Other long term (current) drug therapy: Secondary | ICD-10-CM

## 2022-01-09 DIAGNOSIS — Z6841 Body Mass Index (BMI) 40.0 and over, adult: Secondary | ICD-10-CM

## 2022-01-09 DIAGNOSIS — I10 Essential (primary) hypertension: Secondary | ICD-10-CM | POA: Diagnosis present

## 2022-01-09 LAB — CBC WITH DIFFERENTIAL/PLATELET
Abs Immature Granulocytes: 0.01 10*3/uL (ref 0.00–0.07)
Basophils Absolute: 0 10*3/uL (ref 0.0–0.1)
Basophils Relative: 0 %
Eosinophils Absolute: 0.1 10*3/uL (ref 0.0–0.5)
Eosinophils Relative: 2 %
HCT: 38.7 % (ref 36.0–46.0)
Hemoglobin: 12.5 g/dL (ref 12.0–15.0)
Immature Granulocytes: 0 %
Lymphocytes Relative: 36 %
Lymphs Abs: 2.2 10*3/uL (ref 0.7–4.0)
MCH: 27.5 pg (ref 26.0–34.0)
MCHC: 32.3 g/dL (ref 30.0–36.0)
MCV: 85.2 fL (ref 80.0–100.0)
Monocytes Absolute: 0.5 10*3/uL (ref 0.1–1.0)
Monocytes Relative: 9 %
Neutro Abs: 3.3 10*3/uL (ref 1.7–7.7)
Neutrophils Relative %: 53 %
Platelets: 344 10*3/uL (ref 150–400)
RBC: 4.54 MIL/uL (ref 3.87–5.11)
RDW: 14.2 % (ref 11.5–15.5)
WBC: 6.2 10*3/uL (ref 4.0–10.5)
nRBC: 0 % (ref 0.0–0.2)

## 2022-01-09 LAB — BASIC METABOLIC PANEL
Anion gap: 8 (ref 5–15)
BUN: <5 mg/dL — ABNORMAL LOW (ref 6–20)
CO2: 26 mmol/L (ref 22–32)
Calcium: 9.1 mg/dL (ref 8.9–10.3)
Chloride: 102 mmol/L (ref 98–111)
Creatinine, Ser: 0.86 mg/dL (ref 0.44–1.00)
GFR, Estimated: >60 mL/min (ref >60)
Glucose, Bld: 131 mg/dL — ABNORMAL HIGH (ref 70–99)
Potassium: 3.8 mmol/L (ref 3.5–5.1)
Sodium: 136 mmol/L (ref 135–145)

## 2022-01-09 MED ORDER — IOHEXOL 300 MG/ML  SOLN
75.0000 mL | Freq: Once | INTRAMUSCULAR | Status: AC | PRN
  Administered 2022-01-09: 75 mL via INTRAVENOUS

## 2022-01-09 MED ORDER — DEXAMETHASONE SODIUM PHOSPHATE 10 MG/ML IJ SOLN
10.0000 mg | Freq: Once | INTRAMUSCULAR | Status: AC
  Administered 2022-01-09: 10 mg via INTRAVENOUS
  Filled 2022-01-09: qty 1

## 2022-01-09 MED ORDER — SODIUM CHLORIDE 0.9 % IV SOLN
3.0000 g | Freq: Once | INTRAVENOUS | Status: AC
  Administered 2022-01-09: 3 g via INTRAVENOUS

## 2022-01-09 MED ORDER — SODIUM CHLORIDE 0.9 % IV BOLUS
1000.0000 mL | Freq: Once | INTRAVENOUS | Status: AC
  Administered 2022-01-09: 1000 mL via INTRAVENOUS

## 2022-01-09 NOTE — ED Notes (Signed)
Pt requested ice water, OK per RN Shaun. ?

## 2022-01-09 NOTE — ED Triage Notes (Signed)
Pt c/o sore throat x 1.5 weeks -states she was seen here last week with neg swabs-NAD-steady gait ?

## 2022-01-09 NOTE — ED Provider Notes (Signed)
?[facility] EMERGENCY DEPARTMENT ?Provider Note ? ? ?CSN: [id number] ?Arrival date & time: 01/09/22  1750 ? ?  ? ?History ? ?Chief Complaint  ?Patient presents with  ? Sore Throat  ? ? ?Allison OliphantSheila E Perz is a 51 y.o. female. ? ?HPI ?51 year old female with no significant past medical history presents with sore throat.  Is primarily on the right side.  Now is going into her ear.  She was here 5 days ago and was prescribed lidocaine solution but states that this, lozenges, throat spray, and other over-the-counter meds are not helping.  She still has some nasal drainage/postnasal drip.  Her nasal congestion is not as bad.  Still has a little bit of a cough.  No shortness of breath.  It is somewhat painful to swallow but she is able to swallow and there is no drooling.  No fevers throughout the course of illness.  Pain seems to be going up into her right ear at this point. ? ?Home Medications ?Prior to Admission medications   ?Medication Sig Start Date End Date Taking? Authorizing Provider  ?lidocaine (XYLOCAINE) 2 % solution Use as directed 15 mLs in the mouth or throat as needed for mouth pain. 01/04/22   Khatri, Hina, PA-C  ?methocarbamol (ROBAXIN) 500 MG tablet Take 2 tablets (1,000 mg total) by mouth 4 (four) times daily. 03/07/17   Renne CriglerGeiple, Joshua, PA-C  ?naproxen (NAPROSYN) 500 MG tablet Take 1 tablet (500 mg total) by mouth 2 (two) times daily. 03/07/17   Renne CriglerGeiple, Joshua, PA-C  ?   ? ?Allergies    ?Patient has no known allergies.   ? ?Review of Systems   ?Review of Systems  ?Constitutional:  Negative for fever.  ?HENT:  Positive for congestion, ear pain, postnasal drip and sore throat. Negative for trouble swallowing.   ?Respiratory:  Positive for cough. Negative for shortness of breath.   ?Gastrointestinal:  Negative for vomiting.  ? ?Physical Exam ?Updated Vital Signs ?BP (!) 173/105 (BP Location: Left Arm)   Pulse 82   Temp 98.3 ?F (36.8 ?C) (Oral)   Resp 20   LMP 01/01/2022   SpO2 99%  ?Physical  Exam ?Vitals and nursing note reviewed.  ?Constitutional:   ?   Appearance: She is well-developed. She is obese.  ?HENT:  ?   Head: Normocephalic and atraumatic.  ?   Ears:  ?   Comments: Both TMs are occluded by wax. ?   Mouth/Throat:  ?   Pharynx: Oropharynx is clear. Uvula midline.  ?   Tonsils: No tonsillar exudate or tonsillar abscesses.  ?Neck:  ?   Comments: No anterior neck tenderness or swelling appreciated ?Cardiovascular:  ?   Rate and Rhythm: Normal rate and regular rhythm.  ?   Heart sounds: Normal heart sounds.  ?Pulmonary:  ?   Effort: Pulmonary effort is normal.  ?   Breath sounds: Normal breath sounds.  ?Abdominal:  ?   General: There is no distension.  ?Musculoskeletal:  ?   Cervical back: Neck supple.  ?Lymphadenopathy:  ?   Cervical: No cervical adenopathy.  ?Skin: ?   General: Skin is warm and dry.  ?Neurological:  ?   Mental Status: She is alert.  ? ? ?ED Results / Procedures / Treatments   ?Labs ?(all labs ordered are listed, but only abnormal results are displayed) ?Labs Reviewed  ?BASIC METABOLIC PANEL - Abnormal; Notable for the following components:  ?    Result Value  ? Glucose, Bld 131 (*)   ?  BUN <5 (*)   ? All other components within normal limits  ?CBC WITH DIFFERENTIAL/PLATELET  ? ? ?EKG ?None ? ?Radiology ?DG Neck Soft Tissue ? ?Result Date: 01/09/2022 ?CLINICAL DATA:  Sore throat for 1.5 weeks EXAM: NECK SOFT TISSUES - 1+ VIEW COMPARISON:  None. FINDINGS: Frontal and lateral views of the soft tissues of the neck are obtained. Borderline thickening of the epiglottis could reflect epiglottitis. Airway is widely patent. Prevertebral and retropharyngeal soft tissues are unremarkable. No evidence of tonsillar enlargement. No acute bony abnormalities. Prominent spondylosis at C5-6 and C6-7 with reversal of cervical lordosis. IMPRESSION: 1. Thickening of the epiglottis concerning for epiglottitis. The airway remains widely patent. 2. Unremarkable prevertebral and retropharyngeal soft  tissues. 3. Lower cervical spondylosis. Electronically Signed   By: Sharlet Salina M.D.   On: 01/09/2022 18:42  ? ?CT Soft Tissue Neck W Contrast ? ?Result Date: 01/09/2022 ?CLINICAL DATA:  Initial evaluation for acute sore throat. EXAM: CT NECK WITH CONTRAST TECHNIQUE: Multidetector CT imaging of the neck was performed using the standard protocol following the bolus administration of intravenous contrast. RADIATION DOSE REDUCTION: This exam was performed according to the departmental dose-optimization program which includes automated exposure control, adjustment of the mA and/or kV according to patient size and/or use of iterative reconstruction technique. CONTRAST:  18mL OMNIPAQUE IOHEXOL 300 MG/ML  SOLN COMPARISON:  None. FINDINGS: Pharynx and larynx: Oral cavity within normal limits. No acute inflammatory changes seen about the dentition. Palatine tonsils symmetric and within normal limits. Mild asymmetric thickening noted at the right epiglottis and right aryepiglottic fold, nonspecific, but suspicious for possible acute supraglottitis given the history of sore throat. No retropharyngeal swelling or collection. No other loculated collections elsewhere. Glottis itself within normal limits. Subglottic airway patent clear. Salivary glands: Salivary glands including the parotid and submandibular glands are within normal limits. Thyroid: Normal. Lymph nodes: No enlarged or pathologic adenopathy within the neck. Vascular: Normal intravascular enhancement seen throughout the neck. Limited intracranial: Unremarkable. Visualized orbits: Unremarkable. Mastoids and visualized paranasal sinuses: Paranasal sinuses are clear. Mastoid air cells and middle ear cavities are well pneumatized and free of fluid. Skeleton: No discrete or worrisome osseous lesions. Mild to moderate cervical spondylosis at C5-6 and C6-7. Ossification of the stylohyoid ligaments bilaterally. Upper chest: 4 mm subpleural right upper lobe nodule noted  (series 4, image 112), indeterminate. Visualized upper chest demonstrates no other acute finding. Other: None. IMPRESSION: 1. Mild asymmetric thickening at the right epiglottis and right aryepiglottic fold, suspicious for possible acute supraglottitis given the history of sore throat. 2. No other acute abnormality within the neck. No collections. 3. 4 mm right upper lobe pulmonary nodule, indeterminate. No follow-up needed if patient is low-risk.This recommendation follows the consensus statement: Guidelines for Management of Incidental Pulmonary Nodules Detected on CT Images: From the Fleischner Society 2017; Radiology 2017; 284:228-243. Electronically Signed   By: Rise Mu M.D.   On: 01/09/2022 21:29   ? ?Procedures ?Marland KitchenCritical Care ?Performed by: Pricilla Loveless, MD ?Authorized by: Pricilla Loveless, MD  ? ?Critical care provider statement:  ?  Critical care time (minutes):  35 ?  Critical care time was exclusive of:  Separately billable procedures and treating other patients ?  Critical care was necessary to treat or prevent imminent or life-threatening deterioration of the following conditions:  Respiratory failure ?  Critical care was time spent personally by me on the following activities:  Development of treatment plan with patient or surrogate, discussions with consultants, evaluation of patient's response to  treatment, examination of patient, ordering and review of laboratory studies, ordering and review of radiographic studies, ordering and performing treatments and interventions, pulse oximetry, re-evaluation of patient's condition and review of old charts  ? ? ?Medications Ordered in ED ?Medications  ?sodium chloride 0.9 % bolus 1,000 mL ( Intravenous Stopped 01/09/22 2026)  ?iohexol (OMNIPAQUE) 300 MG/ML solution 75 mL (75 mLs Intravenous Contrast Given 01/09/22 2029)  ?Ampicillin-Sulbactam (UNASYN) 3 g in sodium chloride 0.9 % 100 mL IVPB (3 g Intravenous New Bag/Given 01/09/22 2248)   ?dexamethasone (DECADRON) injection 10 mg (10 mg Intravenous Given 01/09/22 2241)  ? ? ?ED Course/ Medical Decision Making/ A&P ?  ?                        ?Medical Decision Making ?Problems Addressed: ?Supraglottitis without air

## 2022-01-10 ENCOUNTER — Encounter (HOSPITAL_COMMUNITY): Payer: Self-pay | Admitting: Internal Medicine

## 2022-01-10 DIAGNOSIS — R739 Hyperglycemia, unspecified: Secondary | ICD-10-CM | POA: Diagnosis present

## 2022-01-10 DIAGNOSIS — F17200 Nicotine dependence, unspecified, uncomplicated: Secondary | ICD-10-CM | POA: Diagnosis present

## 2022-01-10 DIAGNOSIS — E66813 Obesity, class 3: Secondary | ICD-10-CM | POA: Diagnosis present

## 2022-01-10 DIAGNOSIS — I10 Essential (primary) hypertension: Secondary | ICD-10-CM | POA: Diagnosis present

## 2022-01-10 DIAGNOSIS — J0430 Supraglottitis, unspecified, without obstruction: Secondary | ICD-10-CM

## 2022-01-10 LAB — COMPREHENSIVE METABOLIC PANEL
ALT: 42 U/L (ref 0–44)
AST: 36 U/L (ref 15–41)
Albumin: 3.7 g/dL (ref 3.5–5.0)
Alkaline Phosphatase: 63 U/L (ref 38–126)
Anion gap: 8 (ref 5–15)
BUN: 7 mg/dL (ref 6–20)
CO2: 23 mmol/L (ref 22–32)
Calcium: 9.3 mg/dL (ref 8.9–10.3)
Chloride: 103 mmol/L (ref 98–111)
Creatinine, Ser: 0.83 mg/dL (ref 0.44–1.00)
GFR, Estimated: >60 mL/min (ref >60)
Glucose, Bld: 233 mg/dL — ABNORMAL HIGH (ref 70–99)
Potassium: 3.9 mmol/L (ref 3.5–5.1)
Sodium: 134 mmol/L — ABNORMAL LOW (ref 135–145)
Total Bilirubin: 0.5 mg/dL (ref 0.3–1.2)
Total Protein: 8.5 g/dL — ABNORMAL HIGH (ref 6.5–8.1)

## 2022-01-10 LAB — MAGNESIUM: Magnesium: 2.1 mg/dL (ref 1.7–2.4)

## 2022-01-10 MED ORDER — ACETAMINOPHEN 650 MG RE SUPP: 650.0000 mg | Freq: Four times a day (QID) | RECTAL | Status: DC | PRN

## 2022-01-10 MED ORDER — ONDANSETRON HCL 4 MG PO TABS: 4.0000 mg | ORAL_TABLET | Freq: Four times a day (QID) | ORAL | Status: DC | PRN

## 2022-01-10 MED ORDER — SODIUM CHLORIDE 0.9 % IV SOLN
3.0000 g | Freq: Four times a day (QID) | INTRAVENOUS | Status: DC
  Administered 2022-01-10 – 2022-01-12 (×8): 3 g via INTRAVENOUS
  Filled 2022-01-10 (×10): qty 8

## 2022-01-10 MED ORDER — ONDANSETRON HCL 4 MG/2ML IJ SOLN: 4.0000 mg | Freq: Four times a day (QID) | INTRAMUSCULAR | Status: DC | PRN

## 2022-01-10 MED ORDER — ENOXAPARIN SODIUM 80 MG/0.8ML IJ SOSY
0.5000 mg/kg | PREFILLED_SYRINGE | INTRAMUSCULAR | Status: DC
  Administered 2022-01-10: 62.5 mg via SUBCUTANEOUS
  Filled 2022-01-10: qty 0.8

## 2022-01-10 MED ORDER — DEXAMETHASONE SODIUM PHOSPHATE 10 MG/ML IJ SOLN
6.0000 mg | INTRAMUSCULAR | Status: DC
  Administered 2022-01-10 – 2022-01-11 (×2): 6 mg via INTRAVENOUS
  Filled 2022-01-10 (×2): qty 1

## 2022-01-10 MED ORDER — ACETAMINOPHEN 325 MG PO TABS
650.0000 mg | ORAL_TABLET | Freq: Four times a day (QID) | ORAL | Status: DC | PRN
  Administered 2022-01-10: 650 mg via ORAL
  Filled 2022-01-10: qty 2

## 2022-01-10 MED ORDER — AMLODIPINE BESYLATE 5 MG PO TABS
5.0000 mg | ORAL_TABLET | Freq: Every day | ORAL | Status: DC
  Administered 2022-01-10 – 2022-01-11 (×2): 5 mg via ORAL
  Filled 2022-01-10 (×2): qty 1

## 2022-01-10 NOTE — ED Notes (Signed)
[facility] called for report, ETA - min  ?

## 2022-01-10 NOTE — H&P (Signed)
?History and Physical  ? ? ?Patient: Allison Poole DOB: 1970/12/31 ?DOA: 01/09/2022 ?DOS: the patient was seen and examined on 01/10/2022 ?PCP: Patient, No Pcp Per (Inactive)  ?Patient coming from: Home ? ?Chief Complaint:  ?Chief Complaint  ?Patient presents with  ? Sore Throat  ? ?HPI: Allison Poole is a 51 y.o. female with medical history significant of class III obesity, hypertension, cholecystectomy who is coming from [facility] where she presented for the second time in the last 7 days with sore throat.  She has been seen for 5 days prior and given a lidocaine gel prescription along with over-the-counter lozenges and throat spray.  However, this did not work and the patient started having significant sore throat when swallowing.  She denies fever, chills, but stated she has been coughing which is occasionally productive of whitish sputum.  No chest pain, palpitations, diaphoresis, PND, orthopnea, does state he occasionally gets lower extremity pitting edema.  No abdominal pain, diarrhea, constipation, melena hematochezia.  No dysuria, frequency or hematuria.  She occasionally gets heavy bleeding during her periods, but has not been able to follow-up with GYN.  No polyuria, polydipsia, polyphagia or blurred vision. ? ?Initial vital signs were temperature 98.3 ?F, pulse 87, respiration 20, BP 183/100 mmHg and O2 sat 98% on room air.    The patient was given Unasyn and dexamethasone. ? ?Lab work: Her CBC was normal.CMP  today showed a glucose of 233 milligrams per deciliter and protein levels of 8.5 g/dL.  The rest of the CMP was unremarkable. ? ? Imaging: Plain films and CT soft tissue showing signs of fetal otitis.  Please see images and full radiology report for further details. ?  ?Review of Systems: As mentioned in the history of present illness. All other systems reviewed and are negative. ?History reviewed. No pertinent past medical history. ?Past Surgical History:  ?Procedure  Laterality Date  ? CHOLECYSTECTOMY    ? TUBAL LIGATION    ? ?Social History:  reports that she has been smoking cigarettes. She has been smoking an average of .5 packs per day. She has never used smokeless tobacco. She reports current alcohol use. She reports that she does not use drugs. ? ?No Known Allergies ? ?No family history on file. ? ?Prior to Admission medications   ?Medication Sig Start Date End Date Taking? Authorizing Provider  ?acetaminophen (TYLENOL) 500 MG tablet Take 500 mg by mouth every 6 (six) hours as needed for moderate pain.   Yes [provider]  ?fluticasone (FLONASE) 50 MCG/ACT nasal spray Place 1 spray into both nostrils daily as needed for allergies or rhinitis.   Yes [provider]  ?guaiFENesin (MUCINEX) 600 MG 12 hr tablet Take 600 mg by mouth 2 (two) times daily as needed for cough or to loosen phlegm.   Yes [provider]  ?ibuprofen (ADVIL) 200 MG tablet Take 200 mg by mouth every 6 (six) hours as needed for moderate pain.   Yes [provider]  ?lidocaine (XYLOCAINE) 2 % solution Use as directed 15 mLs in the mouth or throat as needed for mouth pain. 01/04/22  Yes Khatri, Hina, PA-C  ?loratadine (CLARITIN) 10 MG tablet Take 10 mg by mouth daily as needed for allergies.   Yes [provider]  ?methocarbamol (ROBAXIN) 500 MG tablet Take 2 tablets (1,000 mg total) by mouth 4 (four) times daily. ?Patient not taking: Reported on 01/10/2022 03/07/17   Renne Crigler, PA-C  ?naproxen (NAPROSYN) 500  MG tablet Take 1 tablet (500 mg total) by mouth 2 (two) times daily. ?Patient not taking: Reported on 01/10/2022 03/07/17   Renne CriglerGeiple, Joshua, PA-C  ? ? ?Physical Exam: ?Vitals:  ? 01/10/22 0915 01/10/22 1000 01/10/22 1306 01/10/22 1424  ?BP: (!) 167/88 (!) 167/89 (!) 176/85 (!) 196/103  ?Pulse: 61 76 78 82  ?Resp: 12 14 19 20   ?Temp:    99.8 ?F (37.7 ?C)  ?TempSrc:    Oral  ?SpO2: 98% 99% 96% 100%  ? ?Physical Exam ?Vitals and nursing note reviewed.   ?Constitutional:   ?   Appearance: She is well-developed. She is obese.  ?HENT:  ?   Head: Normocephalic.  ?   Mouth/Throat:  ?   Mouth: Mucous membranes are moist.  ?   Pharynx: Pharyngeal swelling and posterior oropharyngeal erythema present.  ?Eyes:  ?   General: No scleral icterus. ?   Pupils: Pupils are equal, round, and reactive to light.  ?Neck:  ?   Vascular: No JVD.  ?Cardiovascular:  ?   Rate and Rhythm: Normal rate and regular rhythm.  ?   Heart sounds: S1 normal and S2 normal.  ?Pulmonary:  ?   Effort: Pulmonary effort is normal.  ?   Breath sounds: Normal breath sounds.  ?Abdominal:  ?   General: Bowel sounds are normal. There is no distension.  ?   Palpations: Abdomen is soft.  ?   Tenderness: There is no abdominal tenderness.  ?Musculoskeletal:  ?   Cervical back: Neck supple.  ?   Right lower leg: No edema.  ?   Left lower leg: No edema.  ?Skin: ?   General: Skin is warm and dry.  ?Neurological:  ?   General: No focal deficit present.  ?   Mental Status: She is alert and oriented to person, place, and time.  ?Psychiatric:     ?   Mood and Affect: Mood normal.     ?   Behavior: Behavior normal.  ? ? ?Data Reviewed: ? ?There are no new results to review at this time. ? ?Assessment and Plan: ?Principal Problem: ?  Supraglottitis ?Symptoms much better today per patient. ?Observation/telemetry. ?Continue Unasyn every 6 hours. ?Continue dexamethasone 6 mg IVP daily. ?Analgesics and antiemetics as needed. ? ?Active Problems: ?  Essential hypertension ?Has been consistently elevated. ?Beginning amlodipine 5 mg p.o. daily. ?Follow-up BP and heart rate. ?Needs to establish with a provider for follow-up. ? ?  Obesity, Class III,  ?BMI 40-49.9 (morbid obesity) (HCC) ?Briefly discussed lifestyle modifications. ?Needs to establish with PCP. ? ?  Nicotine dependence ?Encouraged to discontinue nicotine use. ?Recommended to use the nicotine gum or patches. ? ?  Hyperglycemia ?Check hemoglobin A1c in the  morning. ? ? ? ? Advance Care Planning:   Code Status: Full Code  ? ?Consults:  ? ?Family Communication:  ? ?Severity of Illness: ?The appropriate patient status for this patient is OBSERVATION. Observation status is judged to be reasonable and necessary in order to provide the required intensity of service to ensure the patient's safety. The patient's presenting symptoms, physical exam findings, and initial radiographic and laboratory data in the context of their medical condition is felt to place them at decreased risk for further clinical deterioration. Furthermore, it is anticipated that the patient will be medically stable for discharge from the hospital within 2 midnights of admission.  ? ?Author: ?Bobette Moavid Manuel Travonte Byard, MD ?01/10/2022 2:37 PM ? ?For on call review [email or web address].  ? ?This document  was prepared with Dragon voice recognition software and may contain some unintended transcription errors. ?

## 2022-01-11 DIAGNOSIS — I10 Essential (primary) hypertension: Secondary | ICD-10-CM

## 2022-01-11 LAB — CBC
HCT: 37.5 % (ref 36.0–46.0)
Hemoglobin: 12.1 g/dL (ref 12.0–15.0)
MCH: 26.9 pg (ref 26.0–34.0)
MCHC: 32.3 g/dL (ref 30.0–36.0)
MCV: 83.3 fL (ref 80.0–100.0)
Platelets: 382 10*3/uL (ref 150–400)
RBC: 4.5 MIL/uL (ref 3.87–5.11)
RDW: 14.1 % (ref 11.5–15.5)
WBC: 7.7 10*3/uL (ref 4.0–10.5)
nRBC: 0 % (ref 0.0–0.2)

## 2022-01-11 LAB — BASIC METABOLIC PANEL
Anion gap: 7 (ref 5–15)
BUN: 11 mg/dL (ref 6–20)
CO2: 24 mmol/L (ref 22–32)
Calcium: 8.9 mg/dL (ref 8.9–10.3)
Chloride: 102 mmol/L (ref 98–111)
Creatinine, Ser: 0.84 mg/dL (ref 0.44–1.00)
GFR, Estimated: >60 mL/min (ref >60)
Glucose, Bld: 282 mg/dL — ABNORMAL HIGH (ref 70–99)
Potassium: 3.9 mmol/L (ref 3.5–5.1)
Sodium: 133 mmol/L — ABNORMAL LOW (ref 135–145)

## 2022-01-11 LAB — HEMOGLOBIN A1C
Hgb A1c MFr Bld: 8.4 % — ABNORMAL HIGH (ref 4.8–5.6)
Mean Plasma Glucose: 194.38 mg/dL

## 2022-01-11 LAB — HIV ANTIBODY (ROUTINE TESTING W REFLEX): HIV Screen 4th Generation wRfx: NONREACTIVE

## 2022-01-11 LAB — GLUCOSE, CAPILLARY
Glucose-Capillary: 297 mg/dL — ABNORMAL HIGH (ref 70–99)
Glucose-Capillary: 382 mg/dL — ABNORMAL HIGH (ref 70–99)

## 2022-01-11 MED ORDER — LIVING WELL WITH DIABETES BOOK
Freq: Once | Status: AC
  Filled 2022-01-11: qty 1

## 2022-01-11 MED ORDER — ENOXAPARIN SODIUM 60 MG/0.6ML IJ SOSY
60.0000 mg | PREFILLED_SYRINGE | INTRAMUSCULAR | Status: DC
  Administered 2022-01-11: 60 mg via SUBCUTANEOUS
  Filled 2022-01-11: qty 0.6

## 2022-01-11 MED ORDER — INSULIN ASPART 100 UNIT/ML IJ SOLN
0.0000 [IU] | Freq: Three times a day (TID) | INTRAMUSCULAR | Status: DC
  Administered 2022-01-11: 5 [IU] via SUBCUTANEOUS
  Administered 2022-01-12 (×2): 3 [IU] via SUBCUTANEOUS

## 2022-01-11 MED ORDER — INSULIN GLARGINE-YFGN 100 UNIT/ML ~~LOC~~ SOLN
10.0000 [IU] | Freq: Every day | SUBCUTANEOUS | Status: DC
  Administered 2022-01-11: 10 [IU] via SUBCUTANEOUS
  Filled 2022-01-11 (×2): qty 0.1

## 2022-01-11 MED ORDER — AMLODIPINE BESYLATE 10 MG PO TABS
10.0000 mg | ORAL_TABLET | Freq: Every day | ORAL | Status: DC
  Administered 2022-01-12: 10 mg via ORAL
  Filled 2022-01-11: qty 1

## 2022-01-11 MED ORDER — DIPHENHYDRAMINE HCL 25 MG PO CAPS
25.0000 mg | ORAL_CAPSULE | Freq: Three times a day (TID) | ORAL | Status: DC
  Administered 2022-01-11 – 2022-01-12 (×3): 25 mg via ORAL
  Filled 2022-01-11 (×3): qty 1

## 2022-01-11 NOTE — Progress Notes (Addendum)
Inpatient Diabetes Program Recommendations ? ?AACE/ADA: New Consensus Statement on Inpatient Glycemic Control (2015) ? ?Target Ranges:  Prepandial:   less than 140 mg/dL ?     Peak postprandial:   less than 180 mg/dL (1-2 hours) ?     Critically ill patients:  140 - 180 mg/dL  ? ?Lab Results  ?Component Value Date  ? HGBA1C 8.4 (H) 01/11/2022  ? ? ?Review of Glycemic Control ? Latest Reference Range & Units 01/09/22 19:20 01/10/22 15:39 01/11/22 03:28  ?Glucose 70 - 99 mg/dL 939 (H) 030 (H) 092 (H)  ?(H): Data is abnormally high ? ?Diabetes history: None noted ?Outpatient Diabetes medications: None ?Current orders for Inpatient glycemic control: Decadron 6 mg QD ? ?Inpatient Diabetes Program Recommendations:   ? ?Please consider adding glycemic control order set using Novolog 0-15 units TID and 0-5 units QHS. ? ?A1C is 8.4% (average blood sugar of 194 mg/dL).  Ordered LWWD booklet, RD consult and DM education videos. ? ?Will speak with patient today.  Please Discharge on Metformin and pt needs close follow up with PCP. ? ?Addendum@13 :34: ?Spoke with patient.  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  She was on insulin during pregnancy.  Discussed A1C results with her and explained what an A1C is, basic pathophysiology of DM Type 2, basic home care, basic diabetes diet nutrition principles, importance of checking CBGs and maintaining good CBG control to prevent long-term and short-term complications. Reviewed signs and symptoms of hyperglycemia and hypoglycemia and how to treat hypoglycemia at home. Also reviewed blood sugar goals at home.  ?RNs to provide ongoing basic DM education at bedside with this patient. Have ordered educational booklet, and DM videos. Have also placed RD consult for DM diet education for this patient.  ? ?She is uninsured and does not have a PCP.  Placed TOC consult for assistance.  Educated on The Plate Method, CHO's, portion control, CBGs at home fasting and mid afternoon, F/U with PCP every  3 months, bring meter to PCP office, long and short term complications of uncontrolled BG, and importance of exercise. ? ?Provided her with a [employer] glucometer as she is uninsured. ? ? ?Will continue to follow while inpatient. ? ?Thank you, ?Dulce Sellar, MSN, RN ?Diabetes Coordinator ?Inpatient Diabetes Program ?[phone] (team pager from 8a-5p)] ? ? ? ?

## 2022-01-11 NOTE — Progress Notes (Signed)
[facility] ? ?PROGRESS NOTE ? ?Allison Poole CVE:[id number] DOB: [birth year] DOA: 01/09/2022 ?PCP: Patient, No Pcp Per (Inactive) ? ? ?Brief HPI:   ?51 year old female with a medical history of class III obesity, hypertension, cholecystectomy came to med [facility] where she presented for second time in 7 days for sore throat.  She was seen 5 days prior and was given lidocaine gel prescription with over-the-counter lozenges and throat spray.  This did not work and patient was having pain while swallowing.  In the ED plain films and CT soft tissue showed signs of epiglottitis.  ENT surgeon Dr. Jearld Fenton was consulted by ED provider, patient started on Decadron and IV antibiotics. ? ? ? ?Subjective  ? ?Patient seen and examined, complains of swelling on the upper lip today.  No difficulty swallowing.  Denies dyspnea. ? ? Assessment/Plan:  ? ? ?Supraglottitis ?-Improving with Decadron 6 mg IV daily ?-Continue Unasyn every 6 hours ?-Called and discussed with ENT Dr. Jearld Fenton, he will see patient today ? ?Upper lip swelling ?-Patient says that she usually gets the swelling at home intermittently ?-Swelling usually goes away with Benadryl ?-Unclear etiology, patient is already on Decadron ?-Denies shortness of breath ?-We will start Benadryl 25 mg p.o. every 8 hours ? ?Hypertension ?-Blood pressure has been elevated ?-She is already on 5 mg amlodipine daily ?-We will increase the dose of amlodipine to 10 mg daily ? ?Nicotine dependence ?-Encouraged to discontinue nicotine use ? ?Hyperglycemia/new onset diabetes mellitus type 2 ?-Hemoglobin A1c is elevated at 8.4 ?-Initiate sliding scale insulin with NovoLog ?-We will start Lantus 10 units subcu daily ? ? ? ? ?Medications ? ?  ? amLODipine  5 mg Oral Daily  ? dexamethasone (DECADRON) injection  6 mg Intravenous Q24H  ? diphenhydrAMINE  25 mg Oral Q8H  ? enoxaparin (LOVENOX) injection  60 mg Subcutaneous Q24H  ? living well with diabetes book   Does not apply Once   ? ? ? Data Reviewed:  ? ?CBG: ? ?No results for input(s): GLUCAP in the last 168 hours. ? ?SpO2: 95 %  ? ? ?Vitals:  ? 01/10/22 2127 01/11/22 0124 01/11/22 0650 01/11/22 1539  ?BP: (!) 176/88 (!) 186/84 (!) 188/89 (!) 162/77  ?Pulse: 62 72 60 (!) 58  ?Resp: 20 20 18    ?Temp: 98.2 ?F (36.8 ?C) 98.3 ?F (36.8 ?C) 98.4 ?F (36.9 ?C) 98.4 ?F (36.9 ?C)  ?TempSrc: Oral  Oral   ?SpO2: 97% 96% 96% 95%  ?Weight:      ?Height:      ? ? ? ? ?Data Reviewed: ? ?Basic Metabolic Panel: ?Recent Labs  ?Lab 01/09/22 ?1920 01/10/22 ?1539 01/11/22 ?0328  ?NA 136 134* 133*  ?K 3.8 3.9 3.9  ?CL 102 103 102  ?CO2 26 23 24   ?GLUCOSE 131* 233* 282*  ?BUN <5* 7 11  ?CREATININE 0.86 0.83 0.84  ?CALCIUM 9.1 9.3 8.9  ?MG  --  2.1  --   ? ? ?CBC: ?Recent Labs  ?Lab 01/09/22 ?1920 01/11/22 ?0328  ?WBC 6.2 7.7  ?NEUTROABS 3.3  --   ?HGB 12.5 12.1  ?HCT 38.7 37.5  ?MCV 85.2 83.3  ?PLT 344 382  ? ? ?LFT ?Recent Labs  ?Lab 01/10/22 ?1539  ?AST 36  ?ALT 42  ?ALKPHOS 63  ?BILITOT 0.5  ?PROT 8.5*  ?ALBUMIN 3.7  ? ?  ?Antibiotics: ?Anti-infectives (From admission, onward)  ? ? Start     Dose/Rate Route Frequency Ordered Stop  ? 01/10/22 1600  Ampicillin-Sulbactam (UNASYN) 3 g in sodium chloride 0.9 % 100 mL IVPB       ? 3 g ?200 mL/hr over 30 Minutes Intravenous Every 6 hours 01/10/22 1434    ? 01/09/22 2230  Ampicillin-Sulbactam (UNASYN) 3 g in sodium chloride 0.9 % 100 mL IVPB       ? 3 g ?200 mL/hr over 30 Minutes Intravenous  Once 01/09/22 2220 01/09/22 2318  ? ?  ? ? ? ?DVT prophylaxis: Lovenox ? ?Code Status: Full code ? ?Family Communication: No family at bedside ? ? ?CONSULTS ENT ? ? ?Objective  ? ? ?Physical Examination: ? ? ?General-appears in no acute distress ?Heart-S1-S2, regular, no murmur auscultated ?Lungs-clear to auscultation bilaterally, no wheezing or crackles auscultated ?Abdomen-soft, nontender, no organomegaly ?Extremities-no edema in the lower extremities ?Neuro-alert, oriented x3, no focal deficit noted ? ?Status is: Inpatient:  Supraglottitis ? ? ? ?  ? ? ?Allison Poole ?  ?[facility] ?If 7PM-7AM, please contact night-coverage at [email or web address], ?Office  [phone] ? ? ?01/11/2022, 4:04 PM  LOS: 0 days  ? ? ? ? ? ? ? ? ? ? ?  ?

## 2022-01-12 DIAGNOSIS — R739 Hyperglycemia, unspecified: Secondary | ICD-10-CM

## 2022-01-12 LAB — GLUCOSE, CAPILLARY
Glucose-Capillary: 233 mg/dL — ABNORMAL HIGH (ref 70–99)
Glucose-Capillary: 208 mg/dL — ABNORMAL HIGH (ref 70–99)

## 2022-01-12 MED ORDER — METFORMIN HCL 500 MG PO TABS: 500.0000 mg | ORAL_TABLET | Freq: Two times a day (BID) | ORAL | 3 refills | Status: DC

## 2022-01-12 MED ORDER — AMOXICILLIN-POT CLAVULANATE 875-125 MG PO TABS: 1.0000 | ORAL_TABLET | Freq: Two times a day (BID) | ORAL | 0 refills | Status: AC

## 2022-01-12 MED ORDER — PREDNISONE 10 MG PO TABS: ORAL_TABLET | ORAL | 0 refills | Status: DC

## 2022-01-12 MED ORDER — INSULIN GLARGINE 100 UNIT/ML SOLOSTAR PEN: 10.0000 [IU] | PEN_INJECTOR | Freq: Every day | SUBCUTANEOUS | 2 refills | Status: DC

## 2022-01-12 MED ORDER — AMLODIPINE BESYLATE 10 MG PO TABS: 10.0000 mg | ORAL_TABLET | Freq: Every day | ORAL | 3 refills | Status: DC

## 2022-01-12 MED ORDER — BLOOD GLUCOSE METER KIT: PACK | 0 refills | Status: AC

## 2022-01-12 NOTE — Progress Notes (Signed)
NUTRITION NOTE ? ?RD consulted for nutrition education regarding diabetes.  ? ?Lab Results  ?Component Value Date  ? HGBA1C 8.4 (H) 01/11/2022  ? ?Discharge order and summary entered earlier this afternoon for d/c home.  ? ?RD may be unable to see patient prior to her discharge. Placed "Carbohydrate Counting for People with Diabetes" handout from the Academy of Nutrition and Dietetics in AVS.  ? ? ?Body mass index is 48.43 kg/m?Marland Kitchen Pt meets criteria for morbid obesity based on current BMI. ? ?Current diet order is Carb Modified and she ate 100% at breakfast this AM; no other meal completion percentages available. Labs and medications reviewed.  ? ?No further nutrition interventions warranted at this time. RD contact information provided. If additional nutrition issues arise, please re-consult RD. ? ? ? ? ? ?Trenton Gammon, MS, RD, LDN ?Registered Dietitian II ?Inpatient Clinical Nutrition ?RD pager # and on-call/weekend pager # available in AMION  ? ? ?

## 2022-01-12 NOTE — Progress Notes (Signed)
Inpatient Diabetes Program Recommendations ? ?AACE/ADA: New Consensus Statement on Inpatient Glycemic Control (2015) ? ?Target Ranges:  Prepandial:   less than 140 mg/dL ?     Peak postprandial:   less than 180 mg/dL (1-2 hours) ?     Critically ill patients:  140 - 180 mg/dL  ? ?Lab Results  ?Component Value Date  ? GLUCAP 233 (H) 01/12/2022  ? HGBA1C 8.4 (H) 01/11/2022  ? ? ?Review of Glycemic Control ? Latest Reference Range & Units 01/11/22 17:25 01/11/22 21:01 01/12/22 08:15  ?Glucose-Capillary 70 - 99 mg/dL 297 (H) 989 (H) 211 (H)  ? ? ?Diabetes history: New DM diagnosis this admission ? ?Current orders for Inpatient glycemic control:  ?Semglee 10 units Daily ?Novolog 0-9 units tid ?Decadron 6 mg QD ? ?Inpatient Diabetes Program Recommendations:   ? ?-  Increase Novolog 0-15 units TID while on steroids ? ? ?D/C: ?-    Consider Metformin and pt needs close follow up with PCP. ? ? ?Provided her with a Reli[employer] glucometer as she is uninsured. ? ? ?Will continue to follow while inpatient. ? ?Thanks, ?Christena Deem RN, MSN, BC-ADM ?Inpatient Diabetes Coordinator ?Team Pager [phone] (8a-5p) ?

## 2022-01-12 NOTE — Plan of Care (Signed)
?  Problem: Activity: ?Goal: Risk for activity intolerance will decrease ?Outcome: Adequate for Discharge ?  ?Problem: Nutrition: ?Goal: Adequate nutrition will be maintained ?Outcome: Adequate for Discharge ?  ?Problem: Elimination: ?Goal: Will not experience complications related to bowel motility ?Outcome: Adequate for Discharge ?  ?Problem: Pain Managment: ?Goal: General experience of comfort will improve ?Outcome: Adequate for Discharge ?  ?Problem: Safety: ?Goal: Ability to remain free from injury will improve ?Outcome: Adequate for Discharge ?  ?Problem: Skin Integrity: ?Goal: Risk for impaired skin integrity will decrease ?Outcome: Adequate for Discharge ?  ?

## 2022-01-12 NOTE — Discharge Instructions (Signed)

## 2022-01-12 NOTE — Consult Note (Signed)
Reason for Consult:supraglottis ?Referring Physician: Dr Sharl Ma ? ?Allison Poole is an 51 y.o. female.  ?HPI: History of sore throat and swelling feeling in the throat.  She had a change in her voice.  She was seen at [facility] and transferred to [facility] for diagnosis of supraglottitis.  She had a CT scan that showed some swelling in the supraglottic region.  She has not had this problem previously.  She now after this short timeframe of treatment feels like her throat is completely resolved of the discomfort.  She no longer has any change in her voice.  She feels good. ? ?History reviewed. No pertinent past medical history. ? ?Past Surgical History:  ?Procedure Laterality Date  ? CHOLECYSTECTOMY    ? TUBAL LIGATION    ? ? ?Family History  ?Problem Relation Age of Onset  ? Hypertension Mother   ? Diabetes type II Mother   ? Hypertension Father   ? CAD Father   ? Hypertension Brother   ? Hypertension Other   ? ? ?Social History:  reports that she has been smoking cigarettes. She has been smoking an average of .5 packs per day. She has never used smokeless tobacco. She reports current alcohol use. She reports that she does not use drugs. ? ?Allergies: No Known Allergies ? ?Medications: I have reviewed the patient's current medications. ? ?Results for orders placed or performed during the hospital encounter of 01/09/22 (from the past 48 hour(s))  ?Comprehensive metabolic panel     Status: Abnormal  ? Collection Time: 01/10/22  3:39 PM  ?Result Value Ref Range  ? Sodium 134 (L) 135 - 145 mmol/L  ? Potassium 3.9 3.5 - 5.1 mmol/L  ? Chloride 103 98 - 111 mmol/L  ? CO2 23 22 - 32 mmol/L  ? Glucose, Bld 233 (H) 70 - 99 mg/dL  ?  Comment: Glucose reference range applies only to samples taken after fasting for at least 8 hours.  ? BUN 7 6 - 20 mg/dL  ? Creatinine, Ser 0.83 0.44 - 1.00 mg/dL  ? Calcium 9.3 8.9 - 10.3 mg/dL  ? Total Protein 8.5 (H) 6.5 - 8.1 g/dL  ? Albumin 3.7 3.5 - 5.0 g/dL  ? AST 36 15 - 41 U/L  ?  ALT 42 0 - 44 U/L  ? Alkaline Phosphatase 63 38 - 126 U/L  ? Total Bilirubin 0.5 0.3 - 1.2 mg/dL  ? GFR, Estimated >60 >60 mL/min  ?  Comment: (NOTE) ?Calculated using the CKD-EPI Creatinine Equation (2021) ?  ? Anion gap 8 5 - 15  ?  Comment: Performed at [facility], [address], [place], [place] [address]  ?Magnesium     Status: None  ? Collection Time: 01/10/22  3:39 PM  ?Result Value Ref Range  ? Magnesium 2.1 1.7 - 2.4 mg/dL  ?  Comment: Performed at [facility], [address], [place], [place] [address]  ?HIV Antibody (routine testing w rflx)     Status: None  ? Collection Time: 01/11/22  3:28 AM  ?Result Value Ref Range  ? HIV Screen 4th Generation wRfx Non Reactive Non Reactive  ?  Comment: Performed at [facility], [address] [place], [place] [address]  ?CBC     Status: None  ? Collection Time: 01/11/22  3:28 AM  ?Result Value Ref Range  ? WBC 7.7 4.0 - 10.5 K/uL  ? RBC 4.50 3.87 - 5.11 MIL/uL  ?  Hemoglobin 12.1 12.0 - 15.0 g/dL  ? HCT 37.5 36.0 - 46.0 %  ? MCV 83.3 80.0 - 100.0 fL  ? MCH 26.9 26.0 - 34.0 pg  ? MCHC 32.3 30.0 - 36.0 g/dL  ? RDW 14.1 11.5 - 15.5 %  ? Platelets 382 150 - 400 K/uL  ? nRBC 0.0 0.0 - 0.2 %  ?  Comment: Performed at [facility], [address] [place], [place] [address]  ?Basic metabolic panel     Status: Abnormal  ? Collection Time: 01/11/22  3:28 AM  ?Result Value Ref Range  ? Sodium 133 (L) 135 - 145 mmol/L  ? Potassium 3.9 3.5 - 5.1 mmol/L  ? Chloride 102 98 - 111 mmol/L  ? CO2 24 22 - 32 mmol/L  ? Glucose, Bld 282 (H) 70 - 99 mg/dL  ?  Comment: Glucose reference range applies only to samples taken after fasting for at least 8 hours.  ? BUN 11 6 - 20 mg/dL  ? Creatinine, Ser 0.84 0.44 - 1.00 mg/dL  ? Calcium 8.9 8.9 - 10.3 mg/dL  ? GFR, Estimated >60 >60 mL/min  ?  Comment: (NOTE) ?Calculated using the CKD-EPI Creatinine Equation (2021) ?  ? Anion gap 7 5 - 15  ?  Comment: Performed at [facility], [address], [place], [place] [address]  ?Hemoglobin A1c     Status: Abnormal  ? Collection Time: 01/11/22  3:28 AM  ?Result Value Ref Range  ? Hgb A1c MFr Bld 8.4 (H) 4.8 - 5.6 %  ?  Comment: (NOTE) ?Pre diabetes:          5.7%-6.4% ? ?Diabetes:              >6.4% ? ?Glycemic control for   <7.0% ?adults with diabetes ?  ? Mean Plasma Glucose 194.38 mg/dL  ?  Comment: Performed at [facility], [address] [place], [place] [address]  ?Glucose, capillary     Status: Abnormal  ? Collection Time: 01/11/22  5:25 PM  ?Result Value Ref Range  ? Glucose-Capillary 297 (H) 70 - 99 mg/dL  ?  Comment: Glucose reference range applies only to samples taken after fasting for at least 8 hours.  ?Glucose, capillary     Status: Abnormal  ? Collection Time: 01/11/22  9:01 PM  ?Result Value Ref Range  ? Glucose-Capillary 382 (H) 70 - 99 mg/dL  ?  Comment: Glucose reference range applies only to samples taken after fasting for at least 8 hours.  ?Glucose, capillary     Status: Abnormal  ? Collection Time: 01/12/22  8:15 AM  ?Result Value Ref Range  ? Glucose-Capillary 233 (H) 70 - 99 mg/dL  ?  Comment: Glucose reference range applies only to samples taken after fasting for at least 8 hours.  ? ? ?No results found. ? ?ROS ?Blood pressure (!) 177/85, pulse (!) 52, temperature 98.1 ?F (36.7 ?C), temperature source Oral, resp. rate 16, height 5\' 3"  (1.6 m), weight 124 kg, last menstrual period 01/01/2022, SpO2 98 %. ?Physical Exam ?HENT:  ?   Head: Normocephalic.  ?   Mouth/Throat:  ?   Mouth: Mucous membranes are moist.  ?   Comments: Tonsils are +2 without exudate or erythema.  There is no pharyngeal swelling.  She opens her mouth well.  The tongue looks normal.  There is no evidence of an inflammatory process. ?Eyes:  ?   Conjunctiva/sclera: Conjunctivae normal.  ?Musculoskeletal:  ?  Cervical back: Normal range of motion.  ?Neurological:  ?   Mental Status: She is alert.   ? ? ? ? ?Assessment/Plan: ?Supraglottitis-the swelling on the CT scan was relatively mild and now clinically she is essentially back to normal.  No further sore throat or any change in her voice.  She is able to eat and drink well.  She can continue antibiotics outpatient at this point and may be finished up some steroids.  If she has any further problems she should follow-up with otolaryngology at [phone].  She does not want a flexible fiberoptic exam performed after we discussed the option for this and looking at her larynx. ? ?Suzanna ObeyJohn Michaeline Eckersley ?01/12/2022, 11:24 AM  ? ?  ?

## 2022-01-12 NOTE — Discharge Summary (Signed)
?Physician Discharge Summary ?  ?Patient: Allison Poole MRN: [id number] DOB: 1971/08/07  ?Admit date:     01/09/2022  ?Discharge date: 01/12/22  ?Discharge Physician: Oswald Hillock  ? ?PCP: Patient, No Pcp Per (Inactive)  ? ?Recommendations at discharge:  ? ?Follow-up ENT as needed ?Follow-up PCP in 1 week ? ?Discharge Diagnoses: ?Principal Problem: ?  Supraglottitis ?Active Problems: ?  Essential hypertension ?  Obesity, Class III, BMI 40-49.9 (morbid obesity) ([place]) ?  Nicotine dependence ?  Hyperglycemia ? ?Resolved Problems: ?  * No resolved hospital problems. * ? ?Hospital Course: ?51 year old female with a medical history of class III obesity, hypertension, cholecystectomy came to [facility] where she presented for second time in 7 days for sore throat.  She was seen 5 days prior and was given lidocaine gel prescription with over-the-counter lozenges and throat spray.  This did not work and patient was having pain while swallowing.  In the ED plain films and CT soft tissue showed signs of epiglottitis.  ENT surgeon Dr. Janace Hoard was consulted by ED provider, patient started on Decadron and IV antibiotics. ?  ? ?Assessment and Plan: ? ?Supraglottitis ?-Patient presented with sore throat, found to have supraglottitis on CT soft tissue neck ?-Started on IV Decadron 6 mg daily along with Unasyn every 6 hours ?-Symptoms significantly improved, she was seen by ENT Dr. Janace Hoard yesterday ?-He recommends patient to be discharged on prednisone taper along with Augmentin ?-Patient refused flexible fiberoptic exam, which was offered by ENT ?-She can follow-up with ENT as needed as outpatient ? ?  ?Upper lip swelling ?-Patient says that she usually gets the swelling at home intermittently ?-Swelling usually goes away with Benadryl ?-Unclear etiology, patient is already on Decadron ?-Denies shortness of breath ?-Symptoms have resolved ? ?  ?Hypertension ?-Blood pressure has been elevated ?-She is already on 5 mg amlodipine  daily ?-We will increase the dose of amlodipine to 10 mg daily ?  ?Nicotine dependence ?-Encouraged to discontinue nicotine use ?  ?Hyperglycemia/new onset diabetes mellitus type 2 ?-Hemoglobin A1c is elevated at 8.4 ?-Initiated sliding scale insulin with NovoLog in the hospital ?-We will start Lantus 10 units subcu daily ?-We will also start metformin 500 mg p.o. twice daily ? ?She should follow-up with PCP in 1 week ?  ?  ? ? ?  ? ? ?Consultants: ENT ?Procedures performed:  ?Disposition: Home ?Diet recommendation:  ?Discharge Diet Orders (From admission, onward)  ? ?  Start     Ordered  ? 01/12/22 0000  Diet - low sodium heart healthy       ? 01/12/22 1153  ? ?  ?  ? ?  ? ?Cardiac diet ?DISCHARGE MEDICATION: ?Allergies as of 01/12/2022   ?No Known Allergies ?  ? ?  ?Medication List  ?  ? ?STOP taking these medications   ? ?ibuprofen 200 MG tablet ?Commonly known as: ADVIL ?  ?methocarbamol 500 MG tablet ?Commonly known as: ROBAXIN ?  ?naproxen 500 MG tablet ?Commonly known as: NAPROSYN ?  ? ?  ? ?TAKE these medications   ? ?acetaminophen 500 MG tablet ?Commonly known as: TYLENOL ?Take 500 mg by mouth every 6 (six) hours as needed for moderate pain. ?  ?amLODipine 10 MG tablet ?Commonly known as: NORVASC ?Take 1 tablet (10 mg total) by mouth daily. ?Start taking on: January 13, 2022 ?  ?amoxicillin-clavulanate 875-125 MG tablet ?Commonly known as: Augmentin ?Take 1 tablet by mouth 2 (two) times daily for 5 days. ?  ?  blood glucose meter kit and supplies ?Dispense based on patient and insurance preference. Use up to four times daily as directed. (FOR ICD-9 250.00, 250.01). ?  ?fluticasone 50 MCG/ACT nasal spray ?Commonly known as: FLONASE ?Place 1 spray into both nostrils daily as needed for allergies or rhinitis. ?  ?guaiFENesin 600 MG 12 hr tablet ?Commonly known as: [place] ?Take 600 mg by mouth 2 (two) times daily as needed for cough or to loosen phlegm. ?  ?insulin glargine 100 UNIT/ML Solostar Pen ?Commonly known  as: LANTUS ?Inject 10 Units into the skin at bedtime. ?  ?lidocaine 2 % solution ?Commonly known as: XYLOCAINE ?Use as directed 15 mLs in the mouth or throat as needed for mouth pain. ?  ?loratadine 10 MG tablet ?Commonly known as: CLARITIN ?Take 10 mg by mouth daily as needed for allergies. ?  ?metFORMIN 500 MG tablet ?Commonly known as: Glucophage ?Take 1 tablet (500 mg total) by mouth 2 (two) times daily with a meal. ?  ?predniSONE 10 MG tablet ?Commonly known as: DELTASONE ?Prednisone 40 mg po daily x 1 day then Prednisone 30 mg po daily x 1 day then Prednisone 20 mg po daily x 1 day then Prednisone 10 mg daily x 1 day then stop... ?  ? ?  ? ? Follow-up Information   ? ? Melissa Montane, MD Follow up.   ?Specialty: Otolaryngology ?Why: As needed, If symptoms worsen ?Contact information: ?[address] ?[address] ?[place] [address] ?[phone] ? ? ?  ?  ? ?  ?  ? ?  ? ?Discharge Exam: ?Danley Danker Weights  ? 01/10/22 1711  ?Weight: 124 kg  ? ?General-appears in no acute distress ?Heart-S1-S2, regular, no murmur auscultated ?Lungs-clear to auscultation bilaterally, no wheezing or crackles auscultated ?Abdomen-soft, nontender, no organomegaly ?Extremities-no edema in the lower extremities ?Neuro-alert, oriented x3, no focal deficit noted ? ?Condition at discharge: good ? ?The results of significant diagnostics from this hospitalization (including imaging, microbiology, ancillary and laboratory) are listed below for reference.  ? ?Imaging Studies: ?DG Neck Soft Tissue ? ?Result Date: 01/09/2022 ?CLINICAL DATA:  Sore throat for 1.5 weeks EXAM: NECK SOFT TISSUES - 1+ VIEW COMPARISON:  None. FINDINGS: Frontal and lateral views of the soft tissues of the neck are obtained. Borderline thickening of the epiglottis could reflect epiglottitis. Airway is widely patent. Prevertebral and retropharyngeal soft tissues are unremarkable. No evidence of tonsillar enlargement. No acute bony abnormalities. Prominent spondylosis at C5-6 and  C6-7 with reversal of cervical lordosis. IMPRESSION: 1. Thickening of the epiglottis concerning for epiglottitis. The airway remains widely patent. 2. Unremarkable prevertebral and retropharyngeal soft tissues. 3. Lower cervical spondylosis. Electronically Signed   By: Randa Ngo M.D.   On: 01/09/2022 18:42  ? ?CT Soft Tissue Neck W Contrast ? ?Result Date: 01/09/2022 ?CLINICAL DATA:  Initial evaluation for acute sore throat. EXAM: CT NECK WITH CONTRAST TECHNIQUE: Multidetector CT imaging of the neck was performed using the standard protocol following the bolus administration of intravenous contrast. RADIATION DOSE REDUCTION: This exam was performed according to the departmental dose-optimization program which includes automated exposure control, adjustment of the mA and/or kV according to patient size and/or use of iterative reconstruction technique. CONTRAST:  18m OMNIPAQUE IOHEXOL 300 MG/ML  SOLN COMPARISON:  None. FINDINGS: Pharynx and larynx: Oral cavity within normal limits. No acute inflammatory changes seen about the dentition. Palatine tonsils symmetric and within normal limits. Mild asymmetric thickening noted at the right epiglottis and right aryepiglottic fold, nonspecific, but suspicious for  possible acute supraglottitis given the history of sore throat. No retropharyngeal swelling or collection. No other loculated collections elsewhere. Glottis itself within normal limits. Subglottic airway patent clear. Salivary glands: Salivary glands including the parotid and submandibular glands are within normal limits. Thyroid: Normal. Lymph nodes: No enlarged or pathologic adenopathy within the neck. Vascular: Normal intravascular enhancement seen throughout the neck. Limited intracranial: Unremarkable. Visualized orbits: Unremarkable. Mastoids and visualized paranasal sinuses: Paranasal sinuses are clear. Mastoid air cells and middle ear cavities are well pneumatized and free of fluid. Skeleton: No discrete  or worrisome osseous lesions. Mild to moderate cervical spondylosis at C5-6 and C6-7. Ossification of the stylohyoid ligaments bilaterally. Upper chest: 4 mm subpleural right upper lobe nodule note

## 2022-01-18 ENCOUNTER — Ambulatory Visit: Payer: Self-pay

## 2022-01-18 NOTE — Telephone Encounter (Signed)
?  Chief Complaint: rash ?Symptoms: small red bumps rash on bilat forearms down to hands, itching ?Frequency: since Sunday ?Pertinent Negatives: Patient denies pain to arms or hands ?Disposition: [] ED /[] Urgent Care (no appt availability in office) / [] Appointment(In office/virtual)/ []  [facility]/ [x] Home Care/ [] Refused Recommended Disposition /[] [facility]/ []  Follow-up with PCP ?Additional Notes: pt hadn't used hydrocortisone cream or taken benadryl yet because she wanted to speak with a nurse first. I advised pt to use both for the next couple of days and if no improvement to call back to schedule an appt. Pt also wanted to establish care so pt was scheduled for NPA on 01/26/22 at [facility].  ? ? ?Reason for Disposition ? Mild localized rash ? ?Answer Assessment - Initial Assessment Questions ?1. APPEARANCE of RASH: "Describe the rash."  ?    Small red bumps  ?2. LOCATION: "Where is the rash located?"  ?    Bilat forearm down to hands  ?5. ONSET: "When did the rash start?"  ?    Sunday  ?6. ITCHING: "Does the rash itch?" If Yes, ask: "How bad is the itch?"  (Scale 0-10; or none, mild, moderate, severe) ?    3-4 ?7. PAIN: "Does the rash hurt?" If Yes, ask: "How bad is the pain?"  (Scale 0-10; or none, mild, moderate, severe) ?   - NONE (0): no pain ?   - MILD (1-3): doesn't interfere with normal activities  ?   - MODERATE (4-7): interferes with normal activities or awakens from sleep  ?   - SEVERE (8-10): excruciating pain, unable to do any normal activities ?    Soreness to hands  ?8. OTHER SYMPTOMS: "Do you have any other symptoms?" (e.g., fever) ?    No ? ?Protocols used: Rash or Redness - Localized-A-AH ? ?

## 2022-01-23 ENCOUNTER — Encounter: Payer: Self-pay | Admitting: Physician Assistant

## 2022-01-23 ENCOUNTER — Ambulatory Visit: Payer: Self-pay | Admitting: Physician Assistant

## 2022-01-23 VITALS — BP 130/81 | HR 80 | Temp 98.3°F | Resp 18 | Ht 63.5 in | Wt 267.0 lb

## 2022-01-23 DIAGNOSIS — E1165 Type 2 diabetes mellitus with hyperglycemia: Secondary | ICD-10-CM

## 2022-01-23 DIAGNOSIS — I10 Essential (primary) hypertension: Secondary | ICD-10-CM

## 2022-01-23 DIAGNOSIS — Z6841 Body Mass Index (BMI) 40.0 and over, adult: Secondary | ICD-10-CM

## 2022-01-23 DIAGNOSIS — L309 Dermatitis, unspecified: Secondary | ICD-10-CM

## 2022-01-23 MED ORDER — TRIAMCINOLONE ACETONIDE 0.1 % EX CREA: 1.0000 "application " | TOPICAL_CREAM | Freq: Two times a day (BID) | CUTANEOUS | 0 refills | Status: AC

## 2022-01-23 MED ORDER — HYDROXYZINE HCL 10 MG PO TABS: 10.0000 mg | ORAL_TABLET | Freq: Three times a day (TID) | ORAL | 0 refills | Status: AC | PRN

## 2022-01-23 NOTE — Patient Instructions (Signed)
You are going to use Kenalog cream on your affected areas twice daily until resolved.  If rash does not resolve in the next few days, please feel free to return to the mobile team for further evaluation. ? ?I also sent a low-dose hydroxyzine to help you with the itching. ? ?I strongly encourage you to work on mindful eating, asking yourself if you are hungry, If you are thirsty, If you are stressed, and if you are hungry and want to eat make sure that you are eating without distraction. ? ?Please let us know if there is any else we can do for you ? ?Roney Jaffe, PA-C ?Physician Assistant ?[facility] ?[email or web address] ? ? ?Rash, Adult ?A rash is a change in the color of your skin. A rash can also change the way your skin feels. There are many different conditions and factors that can cause a rash. Some rashes may disappear after a few days, but some may last for a few weeks. Common causes of rashes include: ?Viral infections, such as: ?Colds. ?Measles. ?Hand, foot, and mouth disease. ?Bacterial infections, such as: ?Scarlet fever. ?Impetigo. ?Fungal infections, such as Candida. ?Allergic reactions to food, medicines, or skin care products. ?Follow these instructions at home: ?The goal of treatment is to stop the itching and keep the rash from spreading. Pay attention to any changes in your symptoms. Follow these instructions to help with your condition: ?Medicine ?Take or apply over-the-counter and prescription medicines only as told by your health care provider. These may include: ?Corticosteroid creams to treat red or swollen skin. ?Anti-itch lotions. ?Oral allergy medicines (antihistamines). ?Oral corticosteroids for severe symptoms. ? ?Skin care ?Apply cool compresses to the affected areas. ?Do not scratch or rub your skin. ?Avoid covering the rash. Make sure the rash is exposed to air as much as possible. ?Managing itching and discomfort ?Avoid hot  showers or baths, which can make itching worse. A cold shower may help. ?Try taking a bath with: ?Epsom salts. Follow manufacturer instructions on the packaging. You can get these at your local pharmacy or grocery store. ?Baking soda. Pour a small amount into the bath as told by your health care provider. ?Colloidal oatmeal. Follow manufacturer instructions on the packaging. You can get this at your local pharmacy or grocery store. ?Try applying baking soda paste to your skin. Stir water into baking soda until it reaches a paste-like consistency. ?Try applying calamine lotion. This is an over-the-counter lotion that helps to relieve itchiness. ?Keep cool and out of the sun. Sweating and being hot can make itching worse. ?General instructions ? ?Rest as needed. ?Drink enough fluid to keep your urine pale yellow. ?Wear loose-fitting clothing. ?Avoid scented soaps, detergents, and perfumes. Use gentle soaps, detergents, perfumes, and other cosmetic products. ?Avoid any substance that causes your rash. Keep a journal to help track what causes your rash. Write down: ?What you eat. ?What cosmetic products you use. ?What you drink. ?What you wear. This includes jewelry. ?Keep all follow-up visits as told by your health care provider. This is important. ?Contact a health care provider if: ?You sweat at night. ?You lose weight. ?You urinate more than normal. ?You urinate less than normal, or you notice that your urine is a darker color than usual. ?You feel weak. ?You vomit. ?Your skin or the whites of your eyes look yellow (jaundice). ?Your skin: ?Tingles. ?Is numb. ?Your rash: ?Does not go away after several days. ?Gets worse. ?You are: ?Unusually thirsty. ?More tired than  normal. ?You have: ?New symptoms. ?Pain in your abdomen. ?A fever. ?Diarrhea. ?Get help right away if you: ?Have a fever and your symptoms suddenly get worse. ?Develop confusion. ?Have a severe headache or a stiff neck. ?Have severe joint pains or  stiffness. ?Have a seizure. ?Develop a rash that covers all or most of your body. The rash may or may not be painful. ?Develop blisters that: ?Are on top of the rash. ?Grow larger or grow together. ?Are painful. ?Are inside your nose or mouth. ?Develop a rash that: ?Looks like purple pinprick-sized spots all over your body. ?Has a "bull's eye" or looks like a target. ?Is not related to sun exposure, is red and painful, and causes your skin to peel. ?Summary ?A rash is a change in the color of your skin. Some rashes disappear after a few days, but some may last for a few weeks. ?The goal of treatment is to stop the itching and keep the rash from spreading. ?Take or apply over-the-counter and prescription medicines only as told by your health care provider. ?Contact a health care provider if you have new or worsening symptoms. ?Keep all follow-up visits as told by your health care provider. This is important. ?This information is not intended to replace advice given to you by your health care provider. Make sure you discuss any questions you have with your health care provider. ?Document Revised: 01/24/2019 Document Reviewed: 05/06/2018 ?Elsevier Patient Education ? 2022 [employer]vier Inc. ? ?

## 2022-01-23 NOTE — Progress Notes (Signed)
? ?New Patient Office Visit ? ?Subjective:  ?Patient ID: Allison Poole, female    DOB: 06-Jan-1971  Age: 51 y.o. MRN: [id number] ? ?CC:  ?Chief Complaint  ?Patient presents with  ? Rash  ? ? ?HPI ?Allison Poole states that she was hospitalized from March 27 through January 12, 2022.  Hospital course: ?  ?Discharge Diagnoses: ?Principal Problem: ?  Supraglottitis ?Active Problems: ?  Essential hypertension ?  Obesity, Class III, BMI 40-49.9 (morbid obesity) ([place]) ?  Nicotine dependence ?  Hyperglycemia ?  ?Resolved Problems: ?  * No resolved hospital problems. * ?  ?Hospital Course: ?51 year old female with a medical history of class III obesity, hypertension, cholecystectomy came to [facility] where she presented for second time in 7 days for sore throat.  She was seen 5 days prior and was given lidocaine gel prescription with over-the-counter lozenges and throat spray.  This did not work and patient was having pain while swallowing.  In the ED plain films and CT soft tissue showed signs of epiglottitis.  ENT surgeon Dr. Janace Hoard was consulted by ED provider, patient started on Decadron and IV antibiotics. ?  ?  ?Assessment and Plan: ?  ?Supraglottitis ?-Patient presented with sore throat, found to have supraglottitis on CT soft tissue neck ?-Started on IV Decadron 6 mg daily along with Unasyn every 6 hours ?-Symptoms significantly improved, she was seen by ENT Dr. Janace Hoard yesterday ?-He recommends patient to be discharged on prednisone taper along with Augmentin ?-Patient refused flexible fiberoptic exam, which was offered by ENT ?-She can follow-up with ENT as needed as outpatient ?  ?  ?Upper lip swelling ?-Patient says that she usually gets the swelling at home intermittently ?-Swelling usually goes away with Benadryl ?-Unclear etiology, patient is already on Decadron ?-Denies shortness of breath ?-Symptoms have resolved ?  ?  ?Hypertension ?-Blood pressure has been elevated ?-She is already on 5 mg  amlodipine daily ?-We will increase the dose of amlodipine to 10 mg daily ?  ?Nicotine dependence ?-Encouraged to discontinue nicotine use ?  ?Hyperglycemia/new onset diabetes mellitus type 2 ?-Hemoglobin A1c is elevated at 8.4 ?-Initiated sliding scale insulin with NovoLog in the hospital ?-We will start Lantus 10 units subcu daily ?-We will also start metformin 500 mg p.o. twice daily ?  ?States today that she started having a rash on her forearms, hands, inner thighs, tops of her feet, and chest on Friday.  States that she was started on several new medications, metformin, Lantus, prednisone, and Augmentin.  Denies any new detergents, body washes, fragrances, lotions, no one else in home with similar rash.  States that she has used over-the-counter hydrocortisone and Benadryl without much relief.  Describes the rash as very itchy. ? ?Does endorse that she did complete her course of Augmentin, states her last dose was 2 days prior to rash beginning, also endorses that she was taking prednisone at the same time as taking the Augmentin. ? ? ?States that she has been checking her fasting blood glucose levels, states this morning it was 174.  Does endorse history of gestational diabetes.  States that she has been drinking a lot of sweet tea and is working on improving that.  Also endorses that she does a lot of nighttime snacking and feels that she is overeating. ? ?Has not been checking her blood pressure at home. ?  ? ?History reviewed. No pertinent past medical history. ? ?Past Surgical History:  ?Procedure Laterality Date  ?  CHOLECYSTECTOMY    ? TUBAL LIGATION    ? ? ?Family History  ?Problem Relation Age of Onset  ? Hypertension Mother   ? Diabetes type II Mother   ? Hypertension Father   ? CAD Father   ? Hypertension Brother   ? Hypertension Other   ? ? ?Social History  ? ?Socioeconomic History  ? Marital status: Single  ?  Spouse name: Not on file  ? Number of children: Not on file  ? Years of education: Not on  file  ? Highest education level: Not on file  ?Occupational History  ? Not on file  ?Tobacco Use  ? Smoking status: Former  ?  Types: Cigarettes  ? Smokeless tobacco: Never  ?Vaping Use  ? Vaping Use: Some days  ?Substance and Sexual Activity  ? Alcohol use: Yes  ?  Comment: occ  ? Drug use: No  ? Sexual activity: Not Currently  ?  Birth control/protection: None  ?Other Topics Concern  ? Not on file  ?Social History Narrative  ? Not on file  ? ?Social Determinants of Health  ? ?Financial Resource Strain: Not on file  ?Food Insecurity: Not on file  ?Transportation Needs: Not on file  ?Physical Activity: Not on file  ?Stress: Not on file  ?Social Connections: Not on file  ?Intimate Partner Violence: Not on file  ? ? ?ROS ?Review of Systems  ?Constitutional: Negative.   ?HENT: Negative.    ?Eyes: Negative.   ?Respiratory:  Negative for shortness of breath.   ?Cardiovascular:  Negative for chest pain.  ?Gastrointestinal: Negative.   ?Endocrine: Negative.   ?Genitourinary: Negative.   ?Musculoskeletal: Negative.   ?Skin:  Positive for rash.  ?Allergic/Immunologic: Negative.   ?Neurological: Negative.   ?Hematological: Negative.   ?Psychiatric/Behavioral: Negative.    ? ?Objective:  ? ?Today's Vitals: BP 130/81 (BP Location: Right Arm, Patient Position: Sitting, Cuff Size: Normal)   Pulse 80   Temp 98.3 ?F (36.8 ?C) (Oral)   Resp 18   Ht 5' 3.5" (1.613 m)   Wt 267 lb (121.1 kg)   LMP 01/01/2022   SpO2 98%   BMI 46.56 kg/m?  ? ?Physical Exam ?Vitals and nursing note reviewed.  ?Constitutional:   ?   Appearance: Normal appearance.  ?HENT:  ?   Head: Normocephalic and atraumatic.  ?   Right Ear: External ear normal.  ?   Left Ear: External ear normal.  ?   Nose: Nose normal.  ?   Mouth/Throat:  ?   Mouth: Mucous membranes are moist.  ?   Pharynx: Oropharynx is clear.  ?Eyes:  ?   Conjunctiva/sclera: Conjunctivae normal.  ?   Pupils: Pupils are equal, round, and reactive to light.  ?Cardiovascular:  ?   Rate and  Rhythm: Normal rate and regular rhythm.  ?   Pulses: Normal pulses.  ?   Heart sounds: Normal heart sounds.  ?Pulmonary:  ?   Effort: Pulmonary effort is normal.  ?   Breath sounds: Normal breath sounds.  ?Musculoskeletal:     ?   General: Normal range of motion.  ?   Cervical back: Normal range of motion and neck supple.  ?Skin: ?   General: Skin is warm and dry.  ?   Findings: Rash present. Rash is vesicular. Rash is not pustular.  ?   Comments: Scattered erythematous vesicles in different stages of healing on both feet, upper thighs, both hands, both upper arms, chest, nonpustular  ?Neurological:  ?  General: No focal deficit present.  ?   Mental Status: She is alert and oriented to person, place, and time.  ?Psychiatric:     ?   Mood and Affect: Mood normal.     ?   Behavior: Behavior normal.     ?   Thought Content: Thought content normal.     ?   Judgment: Judgment normal.  ? ? ?Assessment & Plan:  ? ?Problem List Items Addressed This Visit   ? ?  ? Cardiovascular and Mediastinum  ? Essential hypertension  ?  ? Endocrine  ? Type 2 diabetes mellitus with hyperglycemia, without long-term current use of insulin ([place])  ?  ? Other  ? Class 3 severe obesity due to excess calories with serious comorbidity and body mass index (BMI) of 45.0 to 49.9 in adult [facility])  ? ?Other Visit Diagnoses   ? ? Dermatitis    -  Primary  ? Relevant Medications  ? triamcinolone cream (KENALOG) 0.1 %  ? hydrOXYzine (ATARAX) 10 MG tablet  ? ?  ? ? ?Outpatient Encounter Medications as of 01/23/2022  ?Medication Sig  ? acetaminophen (TYLENOL) 500 MG tablet Take 500 mg by mouth every 6 (six) hours as needed for moderate pain.  ? amLODipine (NORVASC) 10 MG tablet Take 1 tablet (10 mg total) by mouth daily.  ? blood glucose meter kit and supplies Dispense based on patient and insurance preference. Use up to four times daily as directed. (FOR ICD-9 250.00, 250.01).  ? fluticasone (FLONASE) 50 MCG/ACT nasal spray Place 1 spray into both nostrils  daily as needed for allergies or rhinitis.  ? hydrOXYzine (ATARAX) 10 MG tablet Take 1 tablet (10 mg total) by mouth 3 (three) times daily as needed.  ? insulin glargine (LANTUS) 100 UNIT/ML Solostar Pen Inject 10 Units i

## 2022-01-23 NOTE — Progress Notes (Signed)
Patient has taken medication and has eaten today. ?Patient reports noticing an itchy rash appearing on her forearms, hands, inner thighs and tops of her feet. Patient reports beginning ED medication Friday and going out of town. Patient denies any Hx of this concern.  ?

## 2022-01-24 DIAGNOSIS — E1165 Type 2 diabetes mellitus with hyperglycemia: Secondary | ICD-10-CM | POA: Insufficient documentation

## 2022-01-24 DIAGNOSIS — Z6841 Body Mass Index (BMI) 40.0 and over, adult: Secondary | ICD-10-CM | POA: Insufficient documentation

## 2022-01-26 ENCOUNTER — Ambulatory Visit: Payer: Medicaid Other | Admitting: Family Medicine

## 2022-03-06 ENCOUNTER — Ambulatory Visit (INDEPENDENT_AMBULATORY_CARE_PROVIDER_SITE_OTHER): Payer: Self-pay | Admitting: Family Medicine

## 2022-03-06 ENCOUNTER — Encounter: Payer: Self-pay | Admitting: Family Medicine

## 2022-03-06 VITALS — BP 116/77 | HR 88 | Temp 98.1°F | Resp 16 | Wt 268.8 lb

## 2022-03-06 DIAGNOSIS — F17210 Nicotine dependence, cigarettes, uncomplicated: Secondary | ICD-10-CM

## 2022-03-06 DIAGNOSIS — I10 Essential (primary) hypertension: Secondary | ICD-10-CM

## 2022-03-06 DIAGNOSIS — Z6841 Body Mass Index (BMI) 40.0 and over, adult: Secondary | ICD-10-CM

## 2022-03-06 DIAGNOSIS — E1165 Type 2 diabetes mellitus with hyperglycemia: Secondary | ICD-10-CM

## 2022-03-06 LAB — POCT GLYCOSYLATED HEMOGLOBIN (HGB A1C): Hemoglobin A1C: 7.6 % — AB (ref 4.0–5.6)

## 2022-03-06 NOTE — Progress Notes (Signed)
Established Patient Office Visit  Subjective    Patient ID: Allison Poole, female    DOB: Jun 21, 1971  Age: 51 y.o. 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    Outpatient Encounter Medications as of 03/06/2022  Medication Sig   acetaminophen (TYLENOL) 500 MG tablet Take 500 mg by mouth every 6 (six) hours as needed for moderate pain.   amLODipine (NORVASC) 10 MG tablet Take 1 tablet (10 mg total) by mouth daily.   BD PEN NEEDLE NANO 2ND GEN 32G X 4 MM MISC See admin instructions.   blood glucose meter kit and supplies Dispense based on patient and insurance preference. Use up to four times daily as directed. (FOR ICD-9 250.00, 250.01).   fluticasone (FLONASE) 50 MCG/ACT nasal spray Place 1 spray into both nostrils daily as needed for allergies or rhinitis.   hydrOXYzine (ATARAX) 10 MG tablet Take 1 tablet (10 mg total) by mouth 3 (three) times daily as needed.   insulin glargine (LANTUS) 100 UNIT/ML Solostar Pen Inject 10 Units into the skin at bedtime.   loratadine (CLARITIN) 10 MG tablet Take 10 mg by mouth daily as needed for allergies.   metFORMIN (GLUCOPHAGE) 500 MG tablet Take 1 tablet (500 mg total) by mouth 2 (two) times daily with a meal.   triamcinolone cream (KENALOG) 0.1 % Apply 1 application. topically 2 (two) times daily.   No facility-administered encounter medications on file as of 03/06/2022.    Past Medical History:  Diagnosis Date   Diabetes mellitus without complication (HCC)    Hypertension     Past Surgical History:  Procedure Laterality Date   CHOLECYSTECTOMY     TUBAL LIGATION      Family History  Problem Relation Age of Onset   Hypertension Mother    Diabetes type II Mother    Hypertension Father    CAD Father    Hypertension Brother    Hypertension Other     Social History   Socioeconomic History   Marital status: Single    Spouse  name: Not on file   Number of children: Not on file   Years of education: Not on file   Highest education level: Not on file  Occupational History   Not on file  Tobacco Use   Smoking status: Former    Types: Cigarettes   Smokeless tobacco: Never  Vaping Use   Vaping Use: Some days  Substance and Sexual Activity   Alcohol use: Yes    Comment: occ   Drug use: No   Sexual activity: Not Currently    Birth control/protection: None  Other Topics Concern   Not on file  Social History Narrative   Not on file   Social Determinants of Health   Financial Resource Strain: Not on file  Food Insecurity: Not on file  Transportation Needs: Not on file  Physical Activity: Not on file  Stress: Not on file  Social Connections: Not on file  Intimate Partner Violence: Not on file    Review of Systems  All other systems reviewed and are negative.      Objective    BP 116/77   Pulse 88   Temp 98.1 F (36.7 C) (Oral)   Resp 16   Wt 268 lb 12.8 oz (121.9 kg)   SpO2 97%   BMI 46.87 kg/m   Physical Exam  Vitals and nursing note reviewed.  Constitutional:      General: She is not in acute distress.    Appearance: She is obese.  Cardiovascular:     Rate and Rhythm: Normal rate and regular rhythm.  Pulmonary:     Effort: Pulmonary effort is normal.     Breath sounds: Normal breath sounds.  Abdominal:     Palpations: Abdomen is soft.     Tenderness: There is no abdominal tenderness.  Musculoskeletal:     Right lower leg: No edema.     Left lower leg: No edema.  Neurological:     General: No focal deficit present.     Mental Status: She is alert and oriented to person, place, and time.        Assessment & Plan:   1. Type 2 diabetes mellitus with hyperglycemia, without long-term current use of insulin (HCC) Improving A1c but not yet at goal. Referral to [facility] for med management. - POCT glycosylated hemoglobin (Hb A1C)  2. Essential hypertension Appears stable with  present management. Continue and monitor  3. Class 3 severe obesity due to excess calories with serious comorbidity and body mass index (BMI) of 45.0 to 49.9 in adult [facility]) Discussed dietary and activity options. Goal is 3-5lbs/mo wt loss. Continue and monitor  4. Cigarette nicotine dependence without complication Discussed cessation/reduction   Return in about 3 months (around 06/06/2022) for follow up.   Becky Sax, MD

## 2022-03-06 NOTE — Progress Notes (Signed)
Patient is here to established care. Patient is DMll  and taking  insulin.  Patient said that she sometimes get finger numbness and is concern.

## 2022-04-04 NOTE — Progress Notes (Unsigned)
S:    Allison Poole is a 51 y.o. female who presents for diabetes evaluation, education, and management. PMH is significant for T2DM, HTN, obesity. Patient was referred and last seen by Primary Care Provider, Dr. Andrey Campanile, on 03/06/22 when A1c was 7.6, improved from 8.4 previously. No medication changes made at that time.   Today, patient arrives in *** good spirits and presents without *** any assistance. *** -titrate metformin? Not on statin. Doesn't have updated lipid panel or UACR  Patient reports Diabetes was diagnosed in ***.   Family/Social History: Former smoker  Current diabetes medications include: metformin 500 mg BID, Lantus 10 units daily Current hypertension medications include: *** Current hyperlipidemia medications include: ***  Patient reports taking all medications as prescribed. Patient {Actions; denies-reports:120008} adherence with medications. Patient reports missing {his/her/their:21314} medications *** times per week, on average.  Do you feel that your medications are working for you? {YES NO:22349} Have you been experiencing any side effects to the medications prescribed? {YES NO:22349} Do you have any problems obtaining medications due to transportation or finances? {YES [id number] Insurance coverage: ***  Patient {Actions; denies-reports:120008} hypoglycemic events.  Reported home fasting blood sugars: ***  Reported 2 hour post-meal/random blood sugars: ***.  Patient {Actions; denies-reports:120008} nocturia (nighttime urination).  Patient {Actions; denies-reports:120008} neuropathy (nerve pain). Patient {Actions; denies-reports:120008} visual changes. Patient {Actions; denies-reports:120008} self foot exams.   Patient reported dietary habits: Eats *** meals/day Breakfast: *** Lunch: *** Dinner: *** Snacks: *** Drinks: ***  Within the past 12 months, did you worry whether your food would run out before you got money to buy more? {YES  NO:22349} Within the past 12 months, did the food you bought run out, and you didn't have money to get more? {YES NO:22349}  Patient-reported exercise habits: ***   O:  Physical Exam  ROS  7 day average blood glucose: ***  Lab Results  Component Value Date   HGBA1C 7.6 (A) 03/06/2022   There were no vitals filed for this visit.  Lipid Panel  No results found for: "CHOL", "TRIG", "HDL", "CHOLHDL", "VLDL", "LDLCALC", "LDLDIRECT"  Clinical Atherosclerotic Cardiovascular Disease (ASCVD): {YES/NO:21197} The ASCVD Risk score (Arnett DK, et al., 2019) failed to calculate for the following reasons:   Cannot find a previous HDL lab   Cannot find a previous total cholesterol lab    A/P: Diabetes longstanding *** currently ***. Patient is *** able to verbalize appropriate hypoglycemia management plan. Medication adherence appears ***. Control is suboptimal due to ***. -{Meds adjust:18428} basal insulin *** (insulin ***). Patient will continue to titrate 1 unit every *** days if fasting blood sugar > 100mg /dl until fasting blood sugars reach goal or next visit.  -{Meds adjust:18428} rapid insulin *** (insulin ***) to ***.  -{Meds adjust:18428} GLP-1 *** (generic ***) to ***.  -{Meds adjust:18428} SGLT2-I *** (generic ***) to ***. Counseled on sick day rules. -{Meds adjust:18428} metformin *** to ***.  -Patient educated on purpose, proper use, and potential adverse effects of ***.  -Extensively discussed pathophysiology of diabetes, recommended lifestyle interventions, dietary effects on blood sugar control.  -Counseled on s/sx of and management of hypoglycemia.  -Next A1c anticipated ***.   ASCVD risk - primary ***secondary prevention in patient with diabetes. Last LDL is *** not at goal of *** mg/dL. ASCVD risk factors include *** and 10-year ASCVD risk score of ***. {Desc; low/moderate/high:110033} intensity statin indicated.  -{Meds adjust:18428} ***statin *** mg.   Hypertension  longstanding *** currently ***. Blood pressure goal  of <130/80 *** mmHg. Medication adherence ***. Blood pressure control is suboptimal due to ***. -***  Written patient instructions provided. Patient verbalized understanding of treatment plan. Total time in face to face counseling *** minutes.    Follow up pharmacist *** PCP clinic visit in ***.

## 2022-04-10 ENCOUNTER — Encounter: Payer: Self-pay | Admitting: Pharmacist

## 2022-04-10 ENCOUNTER — Ambulatory Visit: Payer: Medicaid Other | Attending: Family Medicine | Admitting: Pharmacist

## 2022-04-10 DIAGNOSIS — Z7984 Long term (current) use of oral hypoglycemic drugs: Secondary | ICD-10-CM | POA: Insufficient documentation

## 2022-04-10 DIAGNOSIS — E1165 Type 2 diabetes mellitus with hyperglycemia: Secondary | ICD-10-CM

## 2022-04-10 DIAGNOSIS — Z87891 Personal history of nicotine dependence: Secondary | ICD-10-CM | POA: Insufficient documentation

## 2022-04-10 DIAGNOSIS — I10 Essential (primary) hypertension: Secondary | ICD-10-CM | POA: Insufficient documentation

## 2022-04-10 DIAGNOSIS — E669 Obesity, unspecified: Secondary | ICD-10-CM | POA: Insufficient documentation

## 2022-04-10 DIAGNOSIS — Z794 Long term (current) use of insulin: Secondary | ICD-10-CM | POA: Insufficient documentation

## 2022-04-10 MED ORDER — METFORMIN HCL ER 500 MG PO TB24: 1000.0000 mg | ORAL_TABLET | Freq: Every day | ORAL | 0 refills | Status: DC

## 2022-04-11 LAB — MICROALBUMIN / CREATININE URINE RATIO
Creatinine, Urine: 20.1 mg/dL
Microalb/Creat Ratio: <15 mg/g creat (ref 0–29)
Microalbumin, Urine: <3 ug/mL

## 2022-04-11 LAB — CMP14+EGFR
ALT: 16 IU/L (ref 0–32)
AST: 15 IU/L (ref 0–40)
Albumin/Globulin Ratio: 1.2 (ref 1.2–2.2)
Albumin: 4.1 g/dL (ref 3.8–4.8)
Alkaline Phosphatase: 81 IU/L (ref 44–121)
BUN/Creatinine Ratio: 9 (ref 9–23)
BUN: 8 mg/dL (ref 6–24)
Bilirubin Total: <0.2 mg/dL (ref 0.0–1.2)
CO2: 25 mmol/L (ref 20–29)
Calcium: 9.8 mg/dL (ref 8.7–10.2)
Chloride: 99 mmol/L (ref 96–106)
Creatinine, Ser: 0.89 mg/dL (ref 0.57–1.00)
Globulin, Total: 3.3 g/dL (ref 1.5–4.5)
Glucose: 124 mg/dL — ABNORMAL HIGH (ref 70–99)
Potassium: 4.3 mmol/L (ref 3.5–5.2)
Sodium: 141 mmol/L (ref 134–144)
Total Protein: 7.4 g/dL (ref 6.0–8.5)
eGFR: 79 mL/min/{1.73_m2} (ref >59)

## 2022-04-11 LAB — LDL CHOLESTEROL, DIRECT: LDL Direct: 101 mg/dL — ABNORMAL HIGH (ref 0–99)

## 2022-04-11 LAB — LIPID PANEL
Chol/HDL Ratio: 3 ratio (ref 0.0–4.4)
Cholesterol, Total: 187 mg/dL (ref 100–199)
HDL: 63 mg/dL (ref >39)
LDL Chol Calc (NIH): 96 mg/dL (ref 0–99)
Triglycerides: 166 mg/dL — ABNORMAL HIGH (ref 0–149)
VLDL Cholesterol Cal: 28 mg/dL (ref 5–40)

## 2022-05-18 ENCOUNTER — Ambulatory Visit: Payer: Commercial Managed Care - HMO | Attending: Internal Medicine | Admitting: Pharmacist

## 2022-05-18 ENCOUNTER — Other Ambulatory Visit: Payer: Self-pay | Admitting: Family Medicine

## 2022-05-18 DIAGNOSIS — E1165 Type 2 diabetes mellitus with hyperglycemia: Secondary | ICD-10-CM

## 2022-05-18 MED ORDER — METFORMIN HCL ER 500 MG PO TB24: 1000.0000 mg | ORAL_TABLET | Freq: Two times a day (BID) | ORAL | 0 refills | Status: DC

## 2022-05-18 MED ORDER — OZEMPIC (0.25 OR 0.5 MG/DOSE) 2 MG/3ML ~~LOC~~ SOPN: 0.2500 mg | PEN_INJECTOR | SUBCUTANEOUS | 1 refills | Status: DC

## 2022-05-18 MED ORDER — TRULICITY 0.75 MG/0.5ML ~~LOC~~ SOAJ: 0.7500 mg | SUBCUTANEOUS | 1 refills | Status: DC

## 2022-05-18 MED ORDER — ROSUVASTATIN CALCIUM 5 MG PO TABS: 5.0000 mg | ORAL_TABLET | Freq: Every day | ORAL | 3 refills | Status: DC

## 2022-05-18 NOTE — Progress Notes (Signed)
   S:    Allison Poole is a 51 y.o. female who presents for diabetes evaluation, education, and management. PMH is significant for T2DM, HTN, obesity. Patient was referred and last seen by Primary Care Provider, Dr. Andrey Campanile, on 03/06/22. We saw her on 04/10/22 and changed her metformin to XR d/t GI side effects. We did not start a GLP-1 as pt was in the process of activating new insurance.   Today, patient arrives in good spirits and presents without any assistance. She is happy to report that her new Allison Poole plan is active. She is tolerating metformin about the same vs the immediate release form with some mild GI upset.  Family/Social History: Former smoker  Current diabetes medications include: metformin 1000 mg XR daily (takes as two 500 mg tabs), Lantus 10 units daily  Patient reports taking all medications as prescribed. Patient reports adherence with medications.   Insurance coverage: [employer]a  Patient denies hypoglycemic events.  Reported home fasting blood sugars: 140-190  Reported 2 hour post-meal/random blood sugars: 110-130 Very rare readings over 200  Patient reported dietary habits: Snacks sometimes at night. Watches her sugar intake, chooses low sugar items.   Patient-reported exercise habits: walks 20 minutes at least 3 days per week, working on increase  O:  Lab Results  Component Value Date   HGBA1C 7.6 (A) 03/06/2022   There were no vitals filed for this visit.  Lipid Panel     Component Value Date/Time   CHOL 187 04/10/2022 1607   TRIG 166 (H) 04/10/2022 1607   HDL 63 04/10/2022 1607   CHOLHDL 3.0 04/10/2022 1607   LDLCALC 96 04/10/2022 1607   LDLDIRECT 101 (H) 04/10/2022 1607    Clinical Atherosclerotic Cardiovascular Disease (ASCVD): No  The 10-year ASCVD risk score (Arnett DK, et al., 2019) is: 3.8%   Values used to calculate the score:     Age: 55 years     Sex: Female     Is Non-Hispanic African American: Yes     Diabetic: Yes     Tobacco smoker: No      Systolic Blood Pressure: 116 mmHg     Is BP treated: Yes     HDL Cholesterol: 63 mg/dL     Total Cholesterol: 187 mg/dL   A/P: Diabetes recently diagnosed in 12/2021 currently uncontrolled but with A1c improving since diagnosis. Patient is able to verbalize appropriate hypoglycemia management plan. Medication adherence appears optimal but medication regimen is not optimized.  -Increase metformin to 1000 mg (two 500 mg tablets) twice daily. -Stop Lantus.  -Start Ozempic 0.25 mg once weekly. Patient was educated on the use of the Ozempic pen.  -Extensively discussed pathophysiology of diabetes, recommended lifestyle interventions, dietary effects on blood sugar control.  -Counseled on s/sx of and management of hypoglycemia.  -Next A1c anticipated later this month.  ASCVD risk - primary prevention in patient with diabetes. LDL is 101 and 10-yr ASCVD risk score is 3.8%. Moderate intensity statin therapy should lower her LDL to <70. -Start rosuvastatin 5 mg daily.   Written patient instructions provided. Patient verbalized understanding of treatment plan. Total time in face to face counseling 30 minutes.    Follow up pharmacist visit in 1 month.  Butch Penny, PharmD, Patsy [place], CPP Clinical Pharmacist [facility] [phone]

## 2022-05-19 ENCOUNTER — Other Ambulatory Visit: Payer: Self-pay | Admitting: Family Medicine

## 2022-05-19 ENCOUNTER — Telehealth: Payer: Self-pay | Admitting: Emergency Medicine

## 2022-05-19 NOTE — Telephone Encounter (Signed)
Copied from CRM [id number]. Topic: General - Other >> May 19, 2022  9:54 AM Lyman Speller wrote: Reason for CRM: Insurance wouldn't cover Ozempic and pt was advised to call Franky Macho if there was any issue/ please advise asap

## 2022-05-19 NOTE — Telephone Encounter (Signed)
It looks like Trulicity is preferred by this patient's insurance. Order placed. Patient has been called and informed. No further questions at this time.

## 2022-05-22 NOTE — Telephone Encounter (Signed)
Requested medication (s) are due for refill today:   Yes  Requested medication (s) are on the active medication list:   Yes  Future visit scheduled:   Yes   Last ordered: 05/18/2022 2 ml, 1 refill  Returned because a PA is being requested.   Requested Prescriptions  Pending Prescriptions Disp Refills   TRULICITY 0.75 MG/0.5ML SOPN [Pharmacy Med Name: TRULICITY 0.75 MG/0.5 ML PEN]  1    Sig: INJECT 0.75 MG SUBCUTANEOUSLY ONE TIME PER WEEK     Endocrinology:  Diabetes - GLP-1 Receptor Agonists Passed - 05/19/2022  6:55 PM      Passed - HBA1C is between 0 and 7.9 and within 180 days    Hemoglobin A1C  Date Value Ref Range Status  03/06/2022 7.6 (A) 4.0 - 5.6 % Final   Hgb A1c MFr Bld  Date Value Ref Range Status  01/11/2022 8.4 (H) 4.8 - 5.6 % Final    Comment:    (NOTE) Pre diabetes:          5.7%-6.4%  Diabetes:              >6.4%  Glycemic control for   <7.0% adults with diabetes          Passed - Valid encounter within last 6 months    Recent Outpatient Visits           4 days ago Type 2 diabetes mellitus with hyperglycemia, without long-term current use of insulin (HCC)   [facility] [place], Jeannett Senior L, RPH-CPP   1 month ago Type 2 diabetes mellitus with hyperglycemia, without long-term current use of insulin [facility])   [place] [facility] [place], Jeannett Senior L, RPH-CPP   2 months ago Type 2 diabetes mellitus with hyperglycemia, without long-term current use of insulin [facility])   Primary Care at [facility], MD   3 months ago Dermatitis   Primary Care at [facility], Kasandra Knudsen, [place]       Future Appointments             In 2 weeks Georganna Skeans, MD Primary Care at [facility]   In 1 month Lois Huxley, Cornelius Moras, RPH-CPP [facility]

## 2022-06-01 ENCOUNTER — Other Ambulatory Visit: Payer: Self-pay | Admitting: Family Medicine

## 2022-06-01 NOTE — Telephone Encounter (Signed)
Medication Refill - Medication: amLODipine (NORVASC) 10 MG tablet  Has the patient contacted their pharmacy? Yes.     Preferred Pharmacy (with phone number or street name):  [id number] [place], [place] - [address] Phone:  [phone]  Fax:  [phone]     Has the patient been seen for an appointment in the last year OR does the patient have an upcoming appointment? Yes.

## 2022-06-02 MED ORDER — AMLODIPINE BESYLATE 10 MG PO TABS: 10.0000 mg | ORAL_TABLET | Freq: Every day | ORAL | 1 refills | Status: DC

## 2022-06-02 NOTE — Telephone Encounter (Signed)
Requested medication (s) are due for refill today: yes  Requested medication (s) are on the active medication list:yes  Last refill:  01/09/22  Future visit scheduled: yes  Notes to clinic:  Unable to refill per protocol, last refill by another provider. Medication was las refilled by ED provider, unsure if patient should continue to take. Routing for approval.     Requested Prescriptions  Pending Prescriptions Disp Refills   amLODipine (NORVASC) 10 MG tablet 30 tablet 3    Sig: Take 1 tablet (10 mg total) by mouth daily.     Cardiovascular: Calcium Channel Blockers 2 Passed - 06/01/2022  3:04 PM      Passed - Last BP in normal range    BP Readings from Last 1 Encounters:  03/06/22 116/77         Passed - Last Heart Rate in normal range    Pulse Readings from Last 1 Encounters:  03/06/22 88         Passed - Valid encounter within last 6 months    Recent Outpatient Visits           2 weeks ago Type 2 diabetes mellitus with hyperglycemia, without long-term current use of insulin (HCC)   [facility] [place], Jeannett Senior L, RPH-CPP   1 month ago Type 2 diabetes mellitus with hyperglycemia, without long-term current use of insulin [facility])    [facility] [place], Jeannett Senior L, RPH-CPP   2 months ago Type 2 diabetes mellitus with hyperglycemia, without long-term current use of insulin [facility])   Primary Care at [facility], MD   4 months ago Dermatitis   Primary Care at [facility], Kasandra Knudsen, PA-C       Future Appointments             In 4 days Georganna Skeans, MD Primary Care at [facility]   In 3 weeks Drucilla Chalet, RPH-CPP [facility]

## 2022-06-06 ENCOUNTER — Encounter: Payer: Self-pay | Admitting: Family Medicine

## 2022-06-06 ENCOUNTER — Ambulatory Visit (INDEPENDENT_AMBULATORY_CARE_PROVIDER_SITE_OTHER): Payer: Commercial Managed Care - HMO | Admitting: Family Medicine

## 2022-06-06 VITALS — BP 122/78 | HR 74 | Temp 97.7°F | Resp 16 | Wt 263.0 lb

## 2022-06-06 DIAGNOSIS — E1165 Type 2 diabetes mellitus with hyperglycemia: Secondary | ICD-10-CM

## 2022-06-06 DIAGNOSIS — U070 Vaping-related disorder: Secondary | ICD-10-CM

## 2022-06-06 DIAGNOSIS — F17210 Nicotine dependence, cigarettes, uncomplicated: Secondary | ICD-10-CM | POA: Diagnosis not present

## 2022-06-06 DIAGNOSIS — I10 Essential (primary) hypertension: Secondary | ICD-10-CM | POA: Diagnosis not present

## 2022-06-06 DIAGNOSIS — Z6841 Body Mass Index (BMI) 40.0 and over, adult: Secondary | ICD-10-CM

## 2022-06-06 LAB — POCT GLYCOSYLATED HEMOGLOBIN (HGB A1C): Hemoglobin A1C: 6.7 % — AB (ref 4.0–5.6)

## 2022-06-07 ENCOUNTER — Encounter: Payer: Self-pay | Admitting: Family Medicine

## 2022-06-07 NOTE — Progress Notes (Signed)
Established Patient Office Visit  Subjective    Patient ID: Allison Poole, female    DOB: 1971/03/28  Age: 50 y.o. MRN: [id number]  CC: No chief complaint on file.   HPI Allison Poole presents for routine follow up of chronic med issues including diabetes and hypertension. Patient denies acute complaints or concerns.    Outpatient Encounter Medications as of 06/06/2022  Medication Sig   acetaminophen (TYLENOL) 500 MG tablet Take 500 mg by mouth every 6 (six) hours as needed for moderate pain.   amLODipine (NORVASC) 10 MG tablet Take 1 tablet (10 mg total) by mouth daily.   BD PEN NEEDLE NANO 2ND GEN 32G X 4 MM MISC See admin instructions.   blood glucose meter kit and supplies Dispense based on patient and insurance preference. Use up to four times daily as directed. (FOR ICD-9 250.00, 250.01).   Dulaglutide (TRULICITY) 7.40 CX/4.4YJ SOPN Inject 0.75 mg into the skin once a week.   fluticasone (FLONASE) 50 MCG/ACT nasal spray Place 1 spray into both nostrils daily as needed for allergies or rhinitis.   hydrOXYzine (ATARAX) 10 MG tablet Take 1 tablet (10 mg total) by mouth 3 (three) times daily as needed.   loratadine (CLARITIN) 10 MG tablet Take 10 mg by mouth daily as needed for allergies.   metFORMIN (GLUCOPHAGE-XR) 500 MG 24 hr tablet Take 2 tablets (1,000 mg total) by mouth 2 (two) times daily.   rosuvastatin (CRESTOR) 5 MG tablet Take 1 tablet (5 mg total) by mouth daily.   triamcinolone cream (KENALOG) 0.1 % Apply 1 application. topically 2 (two) times daily.   No facility-administered encounter medications on file as of 06/06/2022.    Past Medical History:  Diagnosis Date   Diabetes mellitus without complication ([place])    Hypertension     Past Surgical History:  Procedure Laterality Date   CHOLECYSTECTOMY     TUBAL LIGATION      Family History  Problem Relation Age of Onset   Hypertension Mother    Diabetes type II Mother    Hypertension Father    CAD Father     Hypertension Brother    Hypertension Other     Social History   Socioeconomic History   Marital status: Divorced    Spouse name: Not on file   Number of children: Not on file   Years of education: Not on file   Highest education level: Not on file  Occupational History   Not on file  Tobacco Use   Smoking status: Former    Types: Cigarettes   Smokeless tobacco: Never  Vaping Use   Vaping Use: Some days  Substance and Sexual Activity   Alcohol use: Yes    Comment: occ   Drug use: No   Sexual activity: Not Currently    Birth control/protection: None  Other Topics Concern   Not on file  Social History Narrative   Not on file   Social Determinants of Health   Financial Resource Strain: Not on file  Food Insecurity: Not on file  Transportation Needs: Not on file  Physical Activity: Not on file  Stress: Not on file  Social Connections: Not on file  Intimate Partner Violence: Not on file    Review of Systems  All other systems reviewed and are negative.       Objective    BP 122/78   Pulse 74   Temp 97.7 F (36.5 C) (Oral)   Resp 16  Wt 263 lb (119.3 kg)   SpO2 96%   BMI 45.86 kg/m   Physical Exam Vitals and nursing note reviewed.  Constitutional:      General: She is not in acute distress.    Appearance: She is obese.  Cardiovascular:     Rate and Rhythm: Normal rate and regular rhythm.  Pulmonary:     Effort: Pulmonary effort is normal.     Breath sounds: Normal breath sounds.  Abdominal:     Palpations: Abdomen is soft.     Tenderness: There is no abdominal tenderness.  Musculoskeletal:     Right lower leg: No edema.     Left lower leg: No edema.  Neurological:     General: No focal deficit present.     Mental Status: She is alert and oriented to person, place, and time.         Assessment & Plan:   1. Type 2 diabetes mellitus with hyperglycemia, without long-term current use of insulin (HCC) Improved A1c and now at goal. Continue  and monitor - POCT glycosylated hemoglobin (Hb A1C)  2. Essential hypertension Appears stable. Continue and monitor  3. Class 3 severe obesity due to excess calories with serious comorbidity and body mass index (BMI) of 45.0 to 49.9 in adult [facility]) Discussed dietary and activity options  4. Cigarette nicotine dependence without complication Discussed reduction/cessation    Return in about 3 months (around 09/06/2022) for follow up.   Becky Sax, MD

## 2022-06-23 ENCOUNTER — Ambulatory Visit: Payer: Commercial Managed Care - HMO | Attending: Family Medicine | Admitting: Pharmacist

## 2022-06-23 DIAGNOSIS — E1165 Type 2 diabetes mellitus with hyperglycemia: Secondary | ICD-10-CM | POA: Diagnosis not present

## 2022-06-23 NOTE — Progress Notes (Signed)
   S:    OZETTA FLATLEY is a 51 y.o. female who presents for diabetes evaluation, education, and management. PMH is significant for T2DM, HTN, obesity. Patient was referred and last seen by Primary Care Provider, Dr. Andrey Campanile, on 06/06/2022. We saw her on 05/18/22 and started Ozempic. Since that we visit, we had to change her to [employer] preference.   Today, patient arrives in good spirits and presents without any assistance. She is happy to report that she is doing well on Trulicity. Her A1c at her PCP visit last month was at goal (6.7%). Commended patient on this. She is tolerating the Trulicity and metformin well. Denies any NV, abdominal pain. No changes in vision.  Family/Social History: Former smoker  Current diabetes medications include: metformin 1000 mg XR daily (takes as two 500 mg tabs BID), Trulicity 0.75 mg weekly.  Patient reports taking all medications as prescribed.  Insurance coverage: Cigna  Patient denies hypoglycemic events.  Reported home fasting blood sugars: mostly in the 120s-130s.  Patient reported dietary habits: Snacks sometimes at night. Watches her sugar intake, chooses low sugar items.   Patient-reported exercise habits: walks 20 minutes at least 3 days per week, working on increase  O:  Lab Results  Component Value Date   HGBA1C 6.7 (A) 06/06/2022   There were no vitals filed for this visit.  Lipid Panel     Component Value Date/Time   CHOL 187 04/10/2022 1607   TRIG 166 (H) 04/10/2022 1607   HDL 63 04/10/2022 1607   CHOLHDL 3.0 04/10/2022 1607   LDLCALC 96 04/10/2022 1607   LDLDIRECT 101 (H) 04/10/2022 1607    Clinical Atherosclerotic Cardiovascular Disease (ASCVD): No  The 10-year ASCVD risk score (Arnett DK, et al., 2019) is: 4.7%   Values used to calculate the score:     Age: 83 years     Sex: Female     Is Non-Hispanic African American: Yes     Diabetic: Yes     Tobacco smoker: No     Systolic Blood Pressure: 122 mmHg      Is BP treated: Yes     HDL Cholesterol: 63 mg/dL     Total Cholesterol: 187 mg/dL   A/P: Diabetes recently diagnosed in 12/2021 currently controlled. Commended patient for this! She is able to verbalize appropriate hypoglycemia management plan. Medication adherence appears optimal. -Continue metformin to 1000 mg (two 500 mg tablets) twice daily. -Increase Trulicity to 1.5mg  weekly for weight loss benefit once she completes her current supply of 0.75 mg pens.  -Extensively discussed pathophysiology of diabetes, recommended lifestyle interventions, dietary effects on blood sugar control.  -Counseled on s/sx of and management of hypoglycemia.  -Next A1c anticipated 08/2022.  Written patient instructions provided. Patient verbalized understanding of treatment plan. Total time in face to face counseling 30 minutes.    Follow up pharmacist visit prn.  Butch Penny, PharmD, Patsy [place], CPP Clinical Pharmacist [facility] [phone]

## 2022-07-11 ENCOUNTER — Other Ambulatory Visit: Payer: Self-pay | Admitting: Family Medicine

## 2022-07-11 MED ORDER — TRULICITY 1.5 MG/0.5ML ~~LOC~~ SOAJ: 1.5000 mg | SUBCUTANEOUS | 1 refills | Status: DC

## 2022-07-20 ENCOUNTER — Telehealth: Payer: Self-pay | Admitting: Family Medicine

## 2022-07-20 NOTE — Telephone Encounter (Signed)
Pt states her Dulaglutide (TRULICITY) 1.5 TJ/0.3ES SOPN Dosage was changed by Lurena Joiner on 9/26.  Her insurance is requesting another prior authorization.  [id number] [place] [place] [address]  Today is the day is was to start this new dose.

## 2022-07-20 NOTE — Telephone Encounter (Signed)
Prior Allison Poole is not required Melene Plan

## 2022-07-20 NOTE — Telephone Encounter (Signed)
[facility], [facility]. He states he has already spoken with pt and insurance today. Insurance told him no PA is needed but pt is responsible for 100% of the cost and that is ~ $1061. Pt has received medication for 2 months now and only paying $20 but insurance wouldn't give him further details and per Bruce they are processing medication same as previous.   Called pt to advise her of my information received and explained that it would be best to have provider or her CMA to call insurance to clarify issues with medication and if not able to get it covered then see what medications are covered. Pt verbalized understanding and I explained that somoene should give her a call back possibly tomorrow.

## 2022-07-21 NOTE — Telephone Encounter (Signed)
Patient said that she has resolved the issue with pharmacy.

## 2022-07-28 ENCOUNTER — Other Ambulatory Visit: Payer: Self-pay | Admitting: Family Medicine

## 2022-07-28 MED ORDER — TRULICITY 1.5 MG/0.5ML ~~LOC~~ SOAJ: 1.5000 mg | SUBCUTANEOUS | 1 refills | Status: AC

## 2022-07-28 NOTE — Telephone Encounter (Signed)
Requested Prescriptions  Pending Prescriptions Disp Refills  . Dulaglutide (TRULICITY) 1.5 OP/9.2TW SOPN 6 mL 1    Sig: Inject 1.5 mg into the skin once a week.     Endocrinology:  Diabetes - GLP-1 Receptor Agonists Passed - 07/28/2022 11:19 AM      Passed - HBA1C is between 0 and 7.9 and within 180 days    Hemoglobin A1C  Date Value Ref Range Status  06/06/2022 6.7 (A) 4.0 - 5.6 % Final   Hgb A1c MFr Bld  Date Value Ref Range Status  01/11/2022 8.4 (H) 4.8 - 5.6 % Final    Comment:    (NOTE) Pre diabetes:          5.7%-6.4%  Diabetes:              >6.4%  Glycemic control for   <7.0% adults with diabetes          Passed - Valid encounter within last 6 months    Recent Outpatient Visits          1 month ago Type 2 diabetes mellitus with hyperglycemia, without long-term current use of insulin [facility])   [place], Annie Main L, RPH-CPP   1 month ago Type 2 diabetes mellitus with hyperglycemia, without long-term current use of insulin [facility])   Primary Care at [facility], [place]   2 months ago Type 2 diabetes mellitus with hyperglycemia, without long-term current use of insulin [facility])   [place], Annie Main L, RPH-CPP   3 months ago Type 2 diabetes mellitus with hyperglycemia, without long-term current use of insulin [facility])   [place], Annie Main L, RPH-CPP   4 months ago Type 2 diabetes mellitus with hyperglycemia, without long-term current use of insulin [facility])   Primary Care at [facility], MD

## 2022-07-28 NOTE — Telephone Encounter (Signed)
Pt states that [facility] pts refill to wrong pharmacy and pt needs refill from 9.26.23 for Dulaglutide (TRULICITY) 1.5 MP/5.3IR SOPN to go to [id number] - [place], [place] - [address] AT [place] please advise

## 2022-12-25 ENCOUNTER — Telehealth: Payer: Self-pay | Admitting: Emergency Medicine

## 2022-12-25 NOTE — Telephone Encounter (Signed)
Copied from [place] [id number]. Topic: General - Other >> Dec 25, 2022  4:30 PM Chapman Fitch wrote: Reason for CRM: Pt would like a call from [facility] about the issue she is having with Prior auth for medications (Trulicity) / Pt has been without medication for over 2 months / please advise

## 2023-01-28 IMAGING — CT CT NECK W/ CM
3 of 4 series · 13 of 33 positions shown, 16 images · IV contrast (Omnipaque)
Comparison: None.

CLINICAL DATA: Initial evaluation for acute sore throat.

EXAM:
CT NECK WITH CONTRAST
TECHNIQUE: Multidetector CT imaging of the neck was performed using the
standard protocol following the bolus administration of intravenous
contrast.

[Series 3: axial neck · axial · 0.60mm/px · z∈[+536,+684]mm · 5 of 112 slices shown, 7 images]
[im 19/112  soft-tissue]
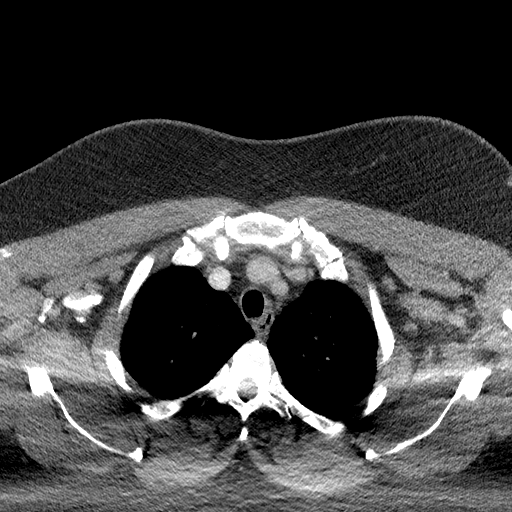
[im 19/112  bone]
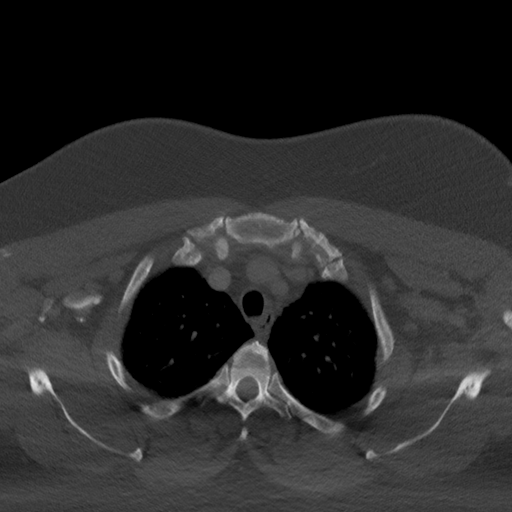
[im 38/112  bone]
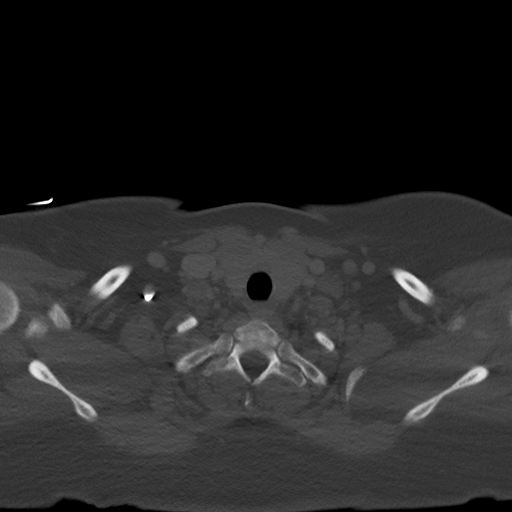
[im 56/112  bone]
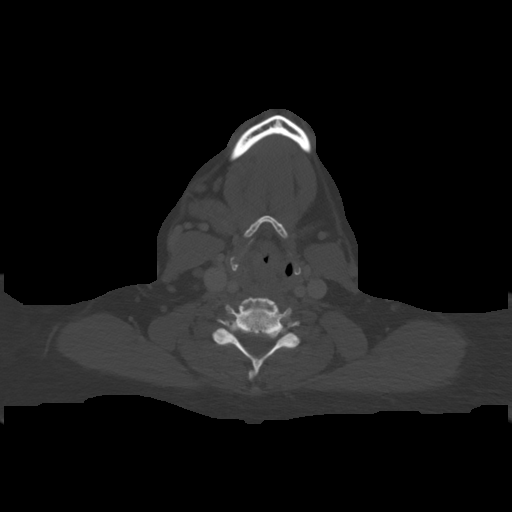
[im 75/112  bone]
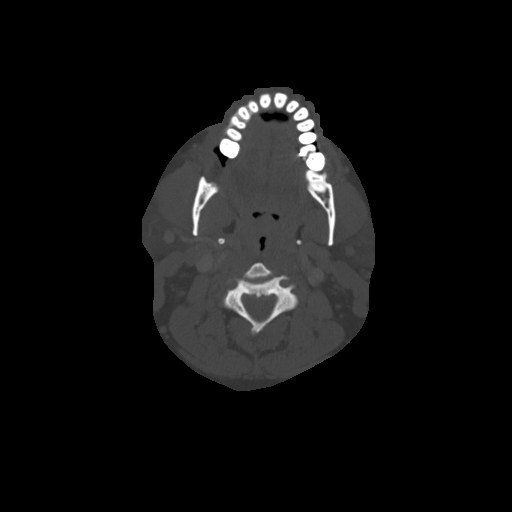
[im 93/112  soft-tissue]
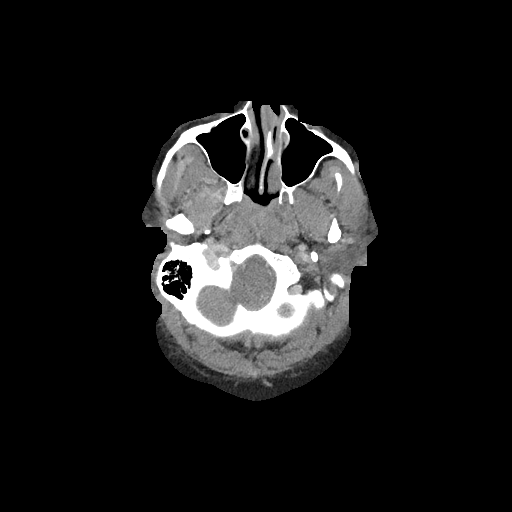
[im 93/112  bone]
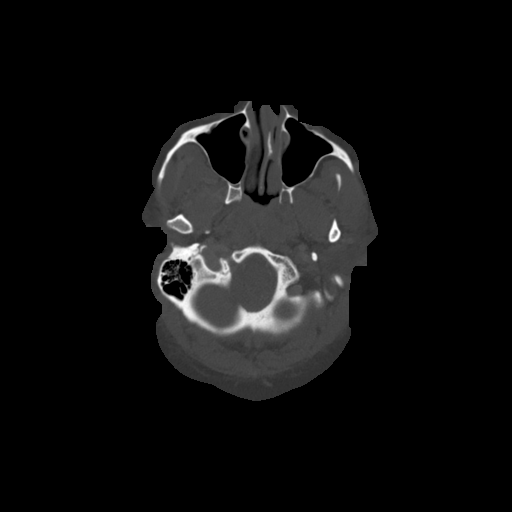

[Series 6: sag neck · sagittal · 0.49mm/px · 5 of 124 slices shown, 6 images]
[im 42/124  bone]
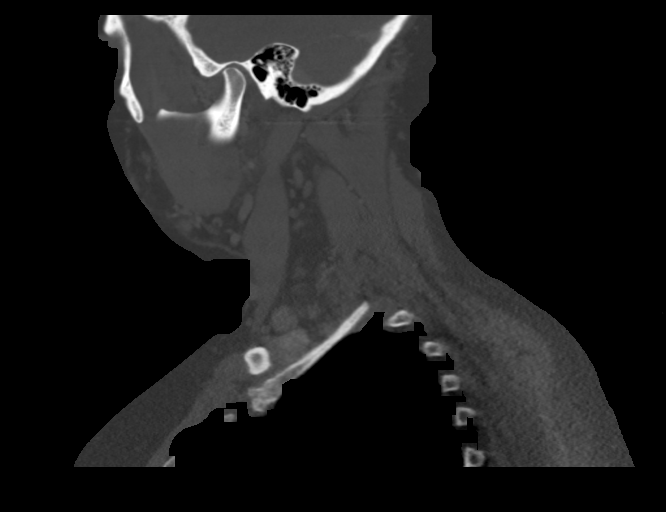
[im 52/124  bone]
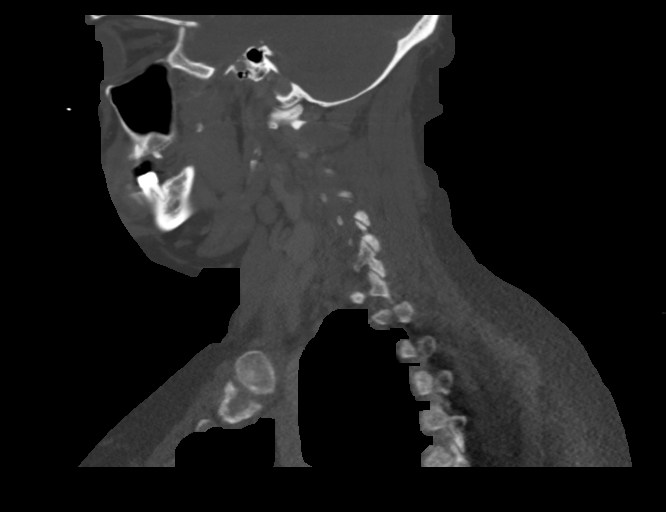
[im 62/124  soft-tissue]
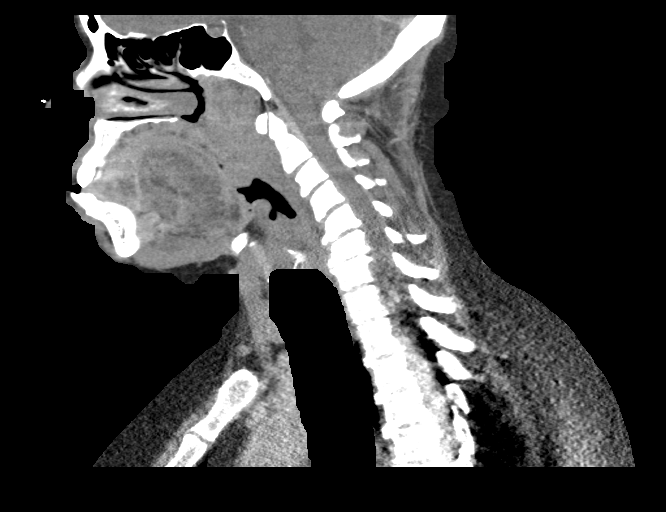
[im 62/124  bone]
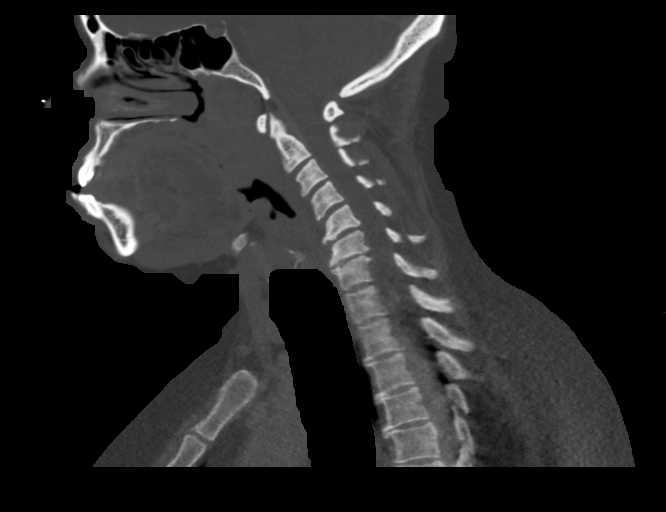
[im 72/124  bone]
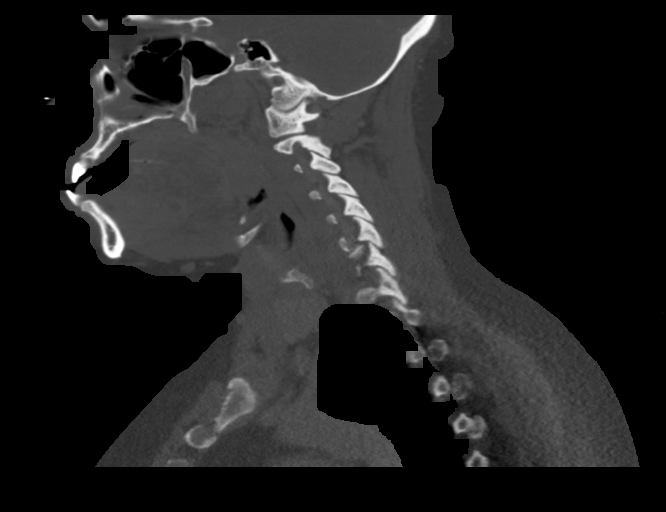
[im 83/124  bone]
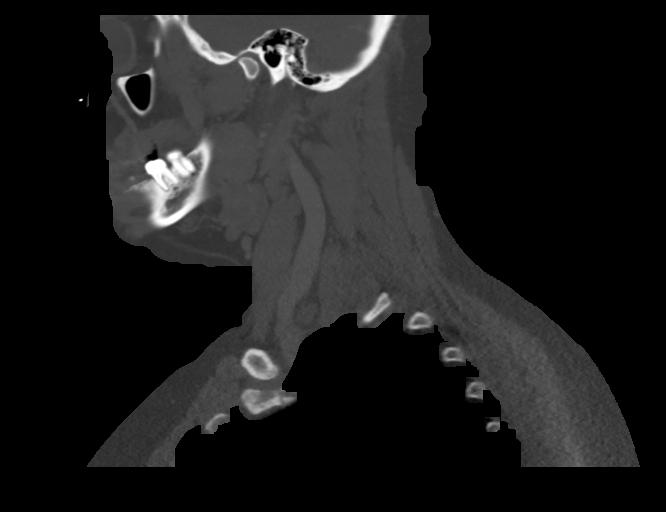

[Series 7: cor neck · coronal · 0.49mm/px · 3 of 151 slices shown]
[im 31/151  bone]
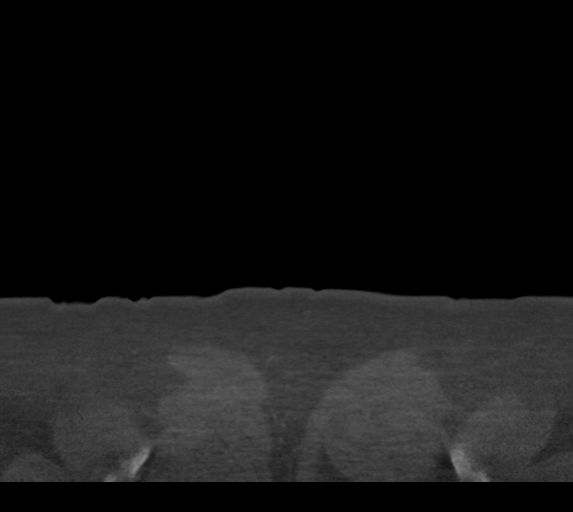
[im 61/151  bone]
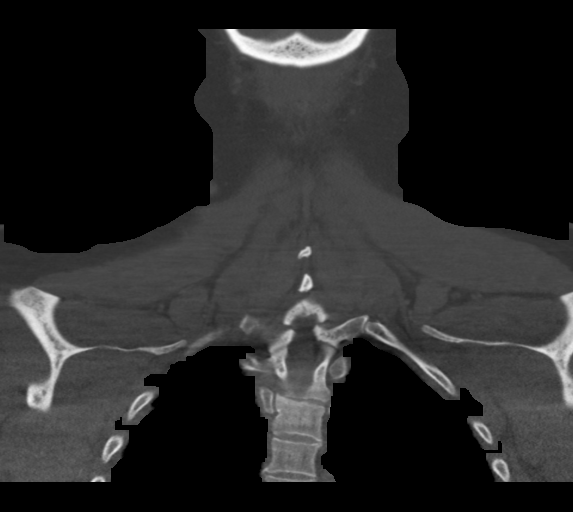
[im 91/151  bone]
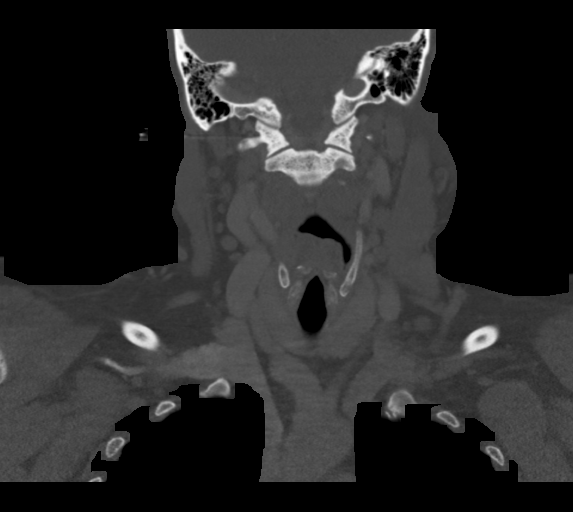

[13 of 33 positions shown; findings below may reference images not displayed]

RADIATION DOSE REDUCTION: This exam was performed according to the
departmental dose-optimization program which includes automated
exposure control, adjustment of the mA and/or kV according to
patient size and/or use of iterative reconstruction technique.

CONTRAST:  75mL OMNIPAQUE IOHEXOL 300 MG/ML  SOLN
FINDINGS: Pharynx and larynx: Oral cavity within normal limits. No acute
inflammatory changes seen about the dentition. Palatine tonsils
symmetric and within normal limits. Mild asymmetric thickening noted
at the right epiglottis and right aryepiglottic fold, nonspecific,
but suspicious for possible acute supraglottitis given the history
of sore throat. No retropharyngeal swelling or collection. No other
loculated collections elsewhere. Glottis itself within normal
limits. Subglottic airway patent clear.

Salivary glands: Salivary glands including the parotid and
submandibular glands are within normal limits.

Thyroid: Normal.

Lymph nodes: No enlarged or pathologic adenopathy within the neck.

Vascular: Normal intravascular enhancement seen throughout the neck.

Limited intracranial: Unremarkable.

Visualized orbits: Unremarkable.

Mastoids and visualized paranasal sinuses: Paranasal sinuses are
clear. Mastoid air cells and middle ear cavities are well
pneumatized and free of fluid.

Skeleton: No discrete or worrisome osseous lesions. Mild to moderate
cervical spondylosis at C5-6 and C6-7. Ossification of the
stylohyoid ligaments bilaterally.

Upper chest: 4 mm subpleural right upper lobe nodule noted (series
4, image 112), indeterminate. Visualized upper chest demonstrates no
other acute finding.

Other: None.
IMPRESSION: 1. Mild asymmetric thickening at the right epiglottis and right
aryepiglottic fold, suspicious for possible acute supraglottitis
given the history of sore throat.
2. No other acute abnormality within the neck. No collections.
3. 4 mm right upper lobe pulmonary nodule, indeterminate. No
follow-up needed if patient is low-risk.This recommendation follows
the consensus statement: Guidelines for Management of Incidental
Pulmonary Nodules Detected on CT Images: From the [HOSPITAL]

## 2023-01-28 IMAGING — DX DG NECK SOFT TISSUE
2 series · 2 of 2 positions shown · non-contrast
Comparison: None.

CLINICAL DATA: Sore throat for 1.5 weeks

EXAM:
NECK SOFT TISSUES - 1+ VIEW

[neck lat]
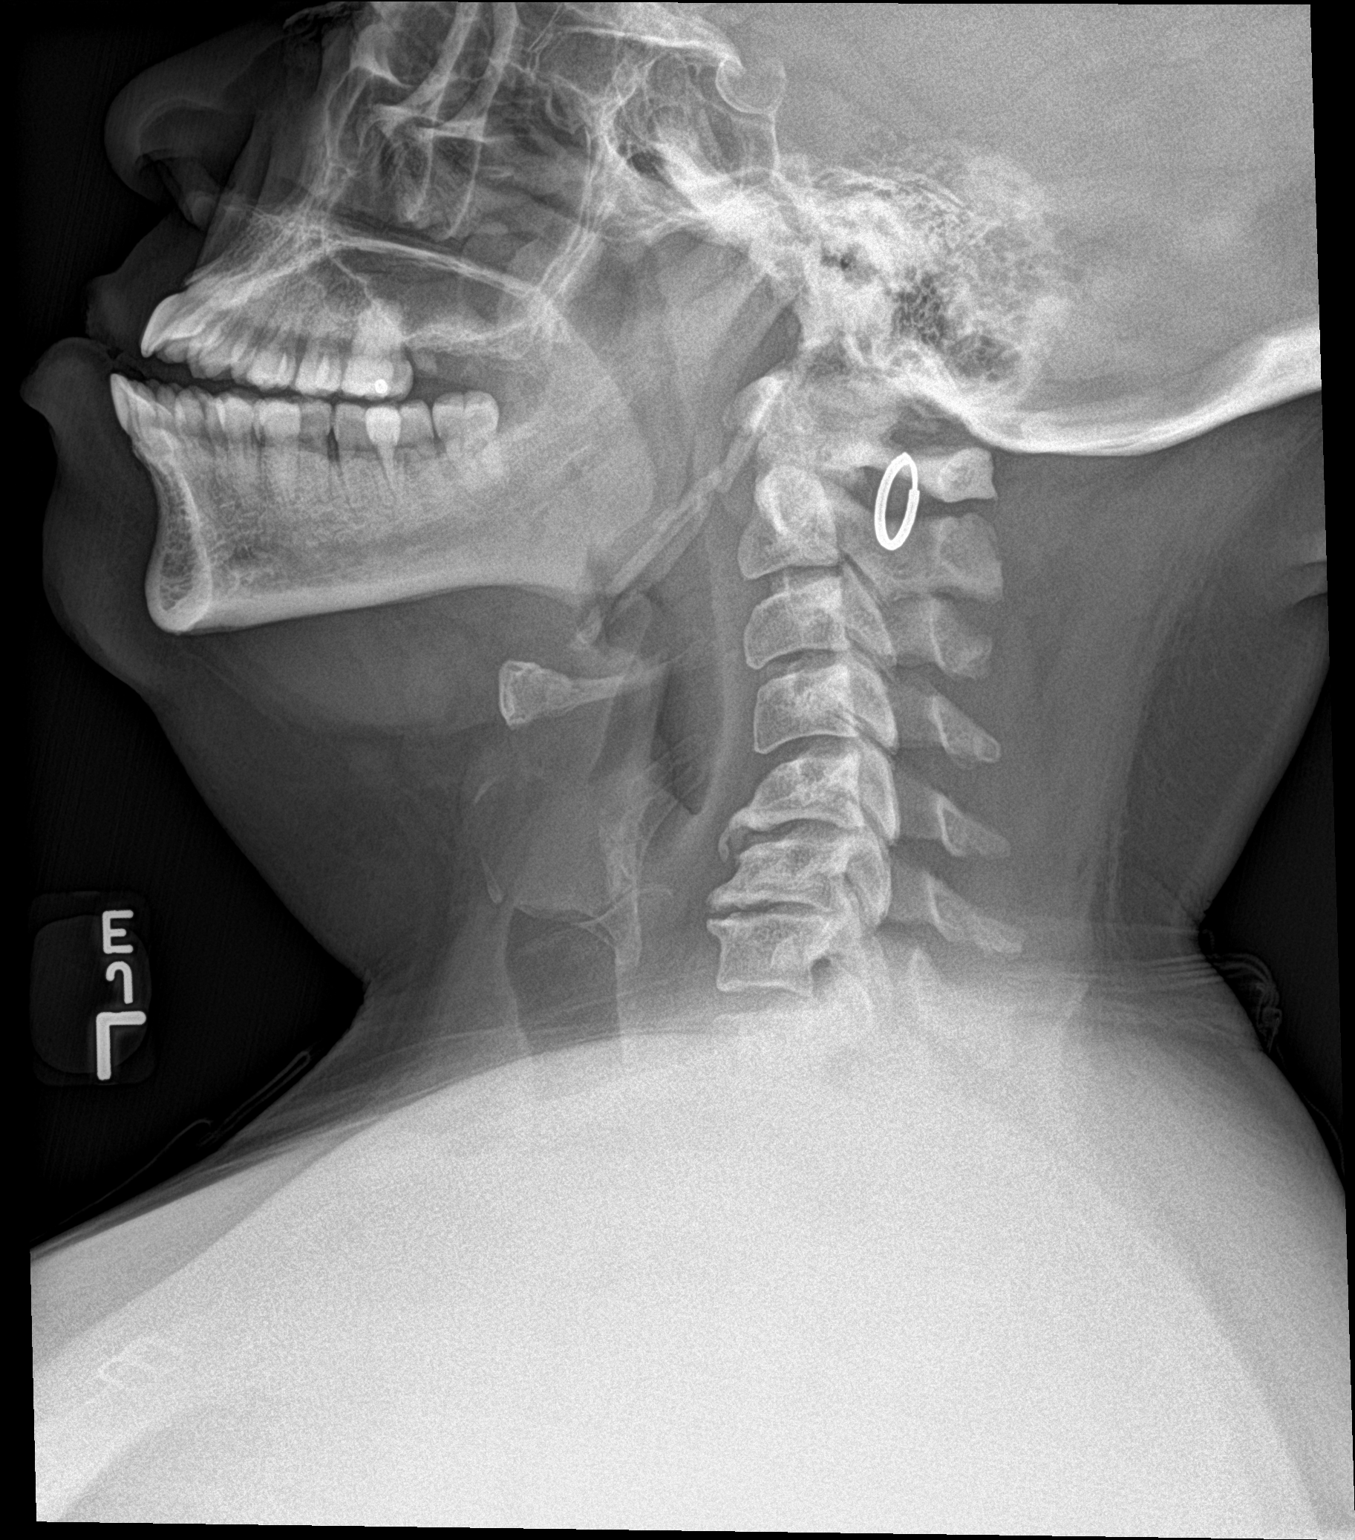

[neck ap]
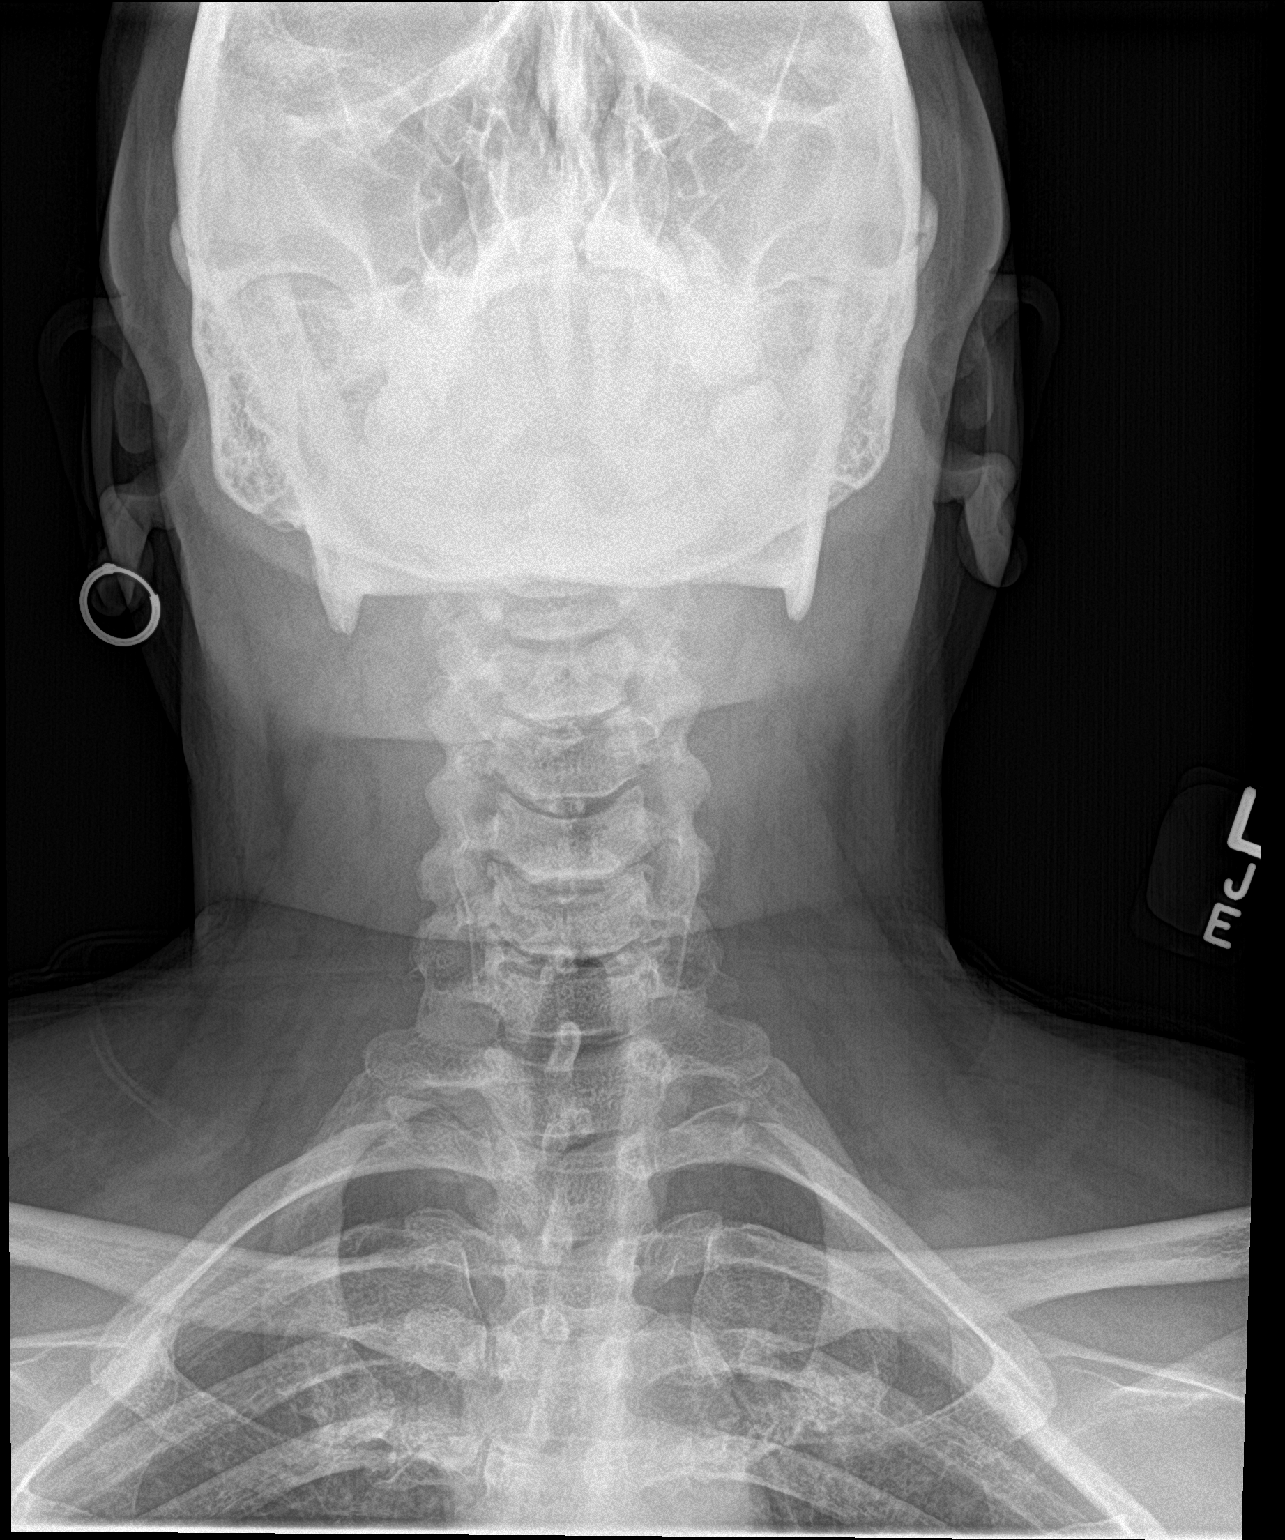

[2 of 2 positions shown; findings below may reference images not displayed]

FINDINGS: Frontal and lateral views of the soft tissues of the neck are
obtained. Borderline thickening of the epiglottis could reflect
epiglottitis. Airway is widely patent. Prevertebral and
retropharyngeal soft tissues are unremarkable. No evidence of
tonsillar enlargement.

No acute bony abnormalities. Prominent spondylosis at C5-6 and C6-7
with reversal of cervical lordosis.
IMPRESSION: 1. Thickening of the epiglottis concerning for epiglottitis. The
airway remains widely patent.
2. Unremarkable prevertebral and retropharyngeal soft tissues.
3. Lower cervical spondylosis.

## 2023-02-28 ENCOUNTER — Emergency Department (HOSPITAL_BASED_OUTPATIENT_CLINIC_OR_DEPARTMENT_OTHER)
Admission: EM | Admit: 2023-02-28 | Discharge: 2023-02-28 | Disposition: A | Discharge status: Home / Self Care | Payer: BLUE CROSS/BLUE SHIELD | Attending: Emergency Medicine | Admitting: Emergency Medicine

## 2023-02-28 ENCOUNTER — Other Ambulatory Visit: Payer: Self-pay

## 2023-02-28 ENCOUNTER — Emergency Department (HOSPITAL_BASED_OUTPATIENT_CLINIC_OR_DEPARTMENT_OTHER): Payer: BLUE CROSS/BLUE SHIELD

## 2023-02-28 ENCOUNTER — Encounter (HOSPITAL_BASED_OUTPATIENT_CLINIC_OR_DEPARTMENT_OTHER): Payer: Self-pay | Admitting: Emergency Medicine

## 2023-02-28 DIAGNOSIS — D72819 Decreased white blood cell count, unspecified: Secondary | ICD-10-CM

## 2023-02-28 DIAGNOSIS — E876 Hypokalemia: Secondary | ICD-10-CM | POA: Diagnosis not present

## 2023-02-28 DIAGNOSIS — E1165 Type 2 diabetes mellitus with hyperglycemia: Secondary | ICD-10-CM

## 2023-02-28 DIAGNOSIS — I10 Essential (primary) hypertension: Secondary | ICD-10-CM | POA: Insufficient documentation

## 2023-02-28 DIAGNOSIS — D649 Anemia, unspecified: Secondary | ICD-10-CM | POA: Insufficient documentation

## 2023-02-28 DIAGNOSIS — R519 Headache, unspecified: Secondary | ICD-10-CM | POA: Diagnosis not present

## 2023-02-28 DIAGNOSIS — Z7984 Long term (current) use of oral hypoglycemic drugs: Secondary | ICD-10-CM | POA: Insufficient documentation

## 2023-02-28 DIAGNOSIS — Z79899 Other long term (current) drug therapy: Secondary | ICD-10-CM | POA: Diagnosis not present

## 2023-02-28 DIAGNOSIS — E119 Type 2 diabetes mellitus without complications: Secondary | ICD-10-CM | POA: Insufficient documentation

## 2023-02-28 DIAGNOSIS — M05062 Felty's syndrome, left knee: Secondary | ICD-10-CM | POA: Insufficient documentation

## 2023-02-28 LAB — CBC WITH DIFFERENTIAL/PLATELET
Abs Immature Granulocytes: 0.01 10*3/uL (ref 0.00–0.07)
Basophils Absolute: 0 10*3/uL (ref 0.0–0.1)
Basophils Relative: 0 %
Eosinophils Absolute: 0 10*3/uL (ref 0.0–0.5)
Eosinophils Relative: 2 %
HCT: 22.5 % — ABNORMAL LOW (ref 36.0–46.0)
Hemoglobin: 7.1 g/dL — ABNORMAL LOW (ref 12.0–15.0)
Immature Granulocytes: 0 %
Lymphocytes Relative: 43 %
Lymphs Abs: 1 10*3/uL (ref 0.7–4.0)
MCH: 27.3 pg (ref 26.0–34.0)
MCHC: 31.6 g/dL (ref 30.0–36.0)
MCV: 86.5 fL (ref 80.0–100.0)
Monocytes Absolute: 0.2 10*3/uL (ref 0.1–1.0)
Monocytes Relative: 7 %
Neutro Abs: 1.1 10*3/uL — ABNORMAL LOW (ref 1.7–7.7)
Neutrophils Relative %: 48 %
Platelets: 337 10*3/uL (ref 150–400)
RBC: 2.6 MIL/uL — ABNORMAL LOW (ref 3.87–5.11)
RDW: 16.5 % — ABNORMAL HIGH (ref 11.5–15.5)
WBC: 2.3 10*3/uL — ABNORMAL LOW (ref 4.0–10.5)
nRBC: 0 % (ref 0.0–0.2)

## 2023-02-28 LAB — COMPREHENSIVE METABOLIC PANEL
ALT: 18 U/L (ref 0–44)
AST: 23 U/L (ref 15–41)
Albumin: 3.9 g/dL (ref 3.5–5.0)
Alkaline Phosphatase: 62 U/L (ref 38–126)
Anion gap: 9 (ref 5–15)
BUN: 9 mg/dL (ref 6–20)
CO2: 20 mmol/L — ABNORMAL LOW (ref 22–32)
Calcium: 9 mg/dL (ref 8.9–10.3)
Chloride: 105 mmol/L (ref 98–111)
Creatinine, Ser: 0.92 mg/dL (ref 0.44–1.00)
GFR, Estimated: >60 mL/min (ref >60)
Glucose, Bld: 90 mg/dL (ref 70–99)
Potassium: 3.9 mmol/L (ref 3.5–5.1)
Sodium: 134 mmol/L — ABNORMAL LOW (ref 135–145)
Total Bilirubin: 0.4 mg/dL (ref 0.3–1.2)
Total Protein: 8.1 g/dL (ref 6.5–8.1)

## 2023-02-28 LAB — PREGNANCY, URINE: Preg Test, Ur: NEGATIVE

## 2023-02-28 LAB — RETICULOCYTES
Immature Retic Fract: 18 % — ABNORMAL HIGH (ref 2.3–15.9)
RBC.: 4.76 MIL/uL (ref 3.87–5.11)
Retic Count, Absolute: 77.6 10*3/uL (ref 19.0–186.0)
Retic Ct Pct: 1.6 % (ref 0.4–3.1)

## 2023-02-28 MED ORDER — METFORMIN HCL ER 500 MG PO TB24
1000.0000 mg | ORAL_TABLET | Freq: Two times a day (BID) | ORAL | 0 refills | Status: AC
Start: 2023-02-28 — End: ?

## 2023-02-28 MED ORDER — AMLODIPINE BESYLATE 5 MG PO TABS
5.0000 mg | ORAL_TABLET | Freq: Once | ORAL | Status: AC
  Administered 2023-02-28: 5 mg via ORAL
  Filled 2023-02-28: qty 1

## 2023-02-28 MED ORDER — ROSUVASTATIN CALCIUM 5 MG PO TABS: 5.0000 mg | ORAL_TABLET | Freq: Every day | ORAL | 3 refills | Status: AC

## 2023-02-28 MED ORDER — AMLODIPINE BESYLATE 5 MG PO TABS: 5.0000 mg | ORAL_TABLET | Freq: Every day | ORAL | 0 refills | Status: AC

## 2023-02-28 MED ORDER — FERROUS SULFATE 325 (65 FE) MG PO TABS: 325.0000 mg | ORAL_TABLET | Freq: Every day | ORAL | 0 refills | Status: AC

## 2023-02-28 NOTE — ED Provider Notes (Signed)
[place] [facility] Provider Note   CSN: [id number] Arrival date & time: 02/28/23  1743     History {Add pertinent medical, surgical, social history, OB history to HPI:1} Chief Complaint  Patient presents with   Hypertension    Allison Poole is a 52 y.o. female.  HPI     52 year old female with a history of type 2 diabetes, hypertension, obesity who presents with concern for hypertension.    Has been off of medications, Trulicity, metformin and amlodipine  Today blood pressure was 189 systolic  For a few weeks, slight, minor headache, not every day. For last month on and off.  Headache today slight.  Denies numbness, weakness, difficulty talking or walking, visual changes or facial droop.  Other than fingers on both sides-thinks carpal tunnel, tingling.  No nausea, vomiting, abdominal pain. No chest pain or dyspnea Has had polyuria and polydipsia      Past Medical History:  Diagnosis Date   Diabetes mellitus without complication (HCC)    Hypertension     Home Medications Prior to Admission medications   Medication Sig Start Date End Date Taking? Authorizing Provider  acetaminophen (TYLENOL) 500 MG tablet Take 500 mg by mouth every 6 (six) hours as needed for moderate pain.    [provider]  amLODipine (NORVASC) 10 MG tablet Take 1 tablet (10 mg total) by mouth daily. 06/02/22   Georganna Skeans, MD  BD PEN NEEDLE NANO 2ND GEN 32G X 4 MM MISC See admin instructions. 01/12/22   [provider]  blood glucose meter kit and supplies Dispense based on patient and insurance preference. Use up to four times daily as directed. (FOR ICD-9 250.00, 250.01). 01/12/22   [name], MD  Dulaglutide (TRULICITY) 1.5 MG/0.5ML SOPN Inject 1.5 mg into the skin once a week. 07/28/22   Georganna Skeans, MD  fluticasone [facility]) 50 MCG/ACT nasal spray Place 1 spray into both nostrils daily as needed for allergies or rhinitis.     [provider]  hydrOXYzine (ATARAX) 10 MG tablet Take 1 tablet (10 mg total) by mouth 3 (three) times daily as needed. 01/23/22   Mayers, Cari S, PA-C  loratadine (CLARITIN) 10 MG tablet Take 10 mg by mouth daily as needed for allergies.    [provider]  metFORMIN (GLUCOPHAGE-XR) 500 MG 24 hr tablet Take 2 tablets (1,000 mg total) by mouth 2 (two) times daily. 05/18/22   Hoy Register, MD  rosuvastatin (CRESTOR) 5 MG tablet Take 1 tablet (5 mg total) by mouth daily. 05/18/22   Hoy Register, MD  triamcinolone cream (KENALOG) 0.1 % Apply 1 application. topically 2 (two) times daily. 01/23/22   Mayers, Cari S, PA-C      Allergies    Patient has no known allergies.    Review of Systems   Review of Systems  Physical Exam Updated Vital Signs BP (!) 179/87 (BP Location: Right Arm)   Pulse 61   Temp 97.7 F (36.5 C)   Resp 18   Ht 5\' 3"  (1.6 m)   Wt 119.7 kg   LMP 02/25/2023 (Exact Date)   SpO2 100%   BMI 46.77 kg/m  Physical Exam  ED Results / Procedures / Treatments   Labs (all labs ordered are listed, but only abnormal results are displayed) Labs Reviewed - No data to display  EKG None  Radiology No results found.  Procedures Procedures  {Document cardiac monitor, telemetry assessment procedure when appropriate:1}  Medications  Ordered in ED Medications - No data to display  ED Course/ Medical Decision Making/ A&P   {   Click here for ABCD2, HEART and other calculatorsREFRESH Note before signing :1}                          Medical Decision Making  ***  {Document critical care time when appropriate:1} {Document review of labs and clinical decision tools ie heart score, Chads2Vasc2 etc:1}  {Document your independent review of radiology images, and any outside records:1} {Document your discussion with family members, caretakers, and with consultants:1} {Document social determinants of health affecting pt's care:1} {Document your decision  making why or why not admission, treatments were needed:1} Final Clinical Impression(s) / ED Diagnoses Final diagnoses:  None    Rx / DC Orders ED Discharge Orders     None

## 2023-02-28 NOTE — ED Notes (Signed)
Pt discharged to home, NAD noted 

## 2023-02-28 NOTE — ED Triage Notes (Signed)
Pt reports HTN (189/91) this AM when checking at home, has had a "slight HA" today and the last few days, reports not taking Amlodipine in 50mo d/t issues w MD, denies CP, ShoB, n/v, dizziness

## 2023-03-01 LAB — IRON AND TIBC
Iron: 24 ug/dL — ABNORMAL LOW (ref 28–170)
Saturation Ratios: 5 % — ABNORMAL LOW (ref 10.4–31.8)
TIBC: 497 ug/dL — ABNORMAL HIGH (ref 250–450)
UIBC: 473 ug/dL

## 2023-03-01 LAB — FOLATE: Folate: 11.4 ng/mL (ref >5.9)

## 2023-03-01 LAB — FERRITIN: Ferritin: 6 ng/mL — ABNORMAL LOW (ref 11–307)

## 2023-03-01 LAB — VITAMIN B12: Vitamin B-12: 201 pg/mL (ref 180–914)

## 2023-05-21 ENCOUNTER — Emergency Department (HOSPITAL_BASED_OUTPATIENT_CLINIC_OR_DEPARTMENT_OTHER)
Admission: EM | Admit: 2023-05-21 | Discharge: 2023-05-21 | Disposition: A | Discharge status: Home / Self Care | Payer: BLUE CROSS/BLUE SHIELD | Source: Home / Self Care | Attending: Emergency Medicine | Admitting: Emergency Medicine

## 2023-05-21 ENCOUNTER — Emergency Department (HOSPITAL_BASED_OUTPATIENT_CLINIC_OR_DEPARTMENT_OTHER): Payer: BLUE CROSS/BLUE SHIELD

## 2023-05-21 ENCOUNTER — Encounter (HOSPITAL_BASED_OUTPATIENT_CLINIC_OR_DEPARTMENT_OTHER): Payer: Self-pay | Admitting: Emergency Medicine

## 2023-05-21 ENCOUNTER — Other Ambulatory Visit: Payer: Self-pay

## 2023-05-21 DIAGNOSIS — I10 Essential (primary) hypertension: Secondary | ICD-10-CM | POA: Diagnosis not present

## 2023-05-21 DIAGNOSIS — M549 Dorsalgia, unspecified: Secondary | ICD-10-CM | POA: Diagnosis present

## 2023-05-21 DIAGNOSIS — E119 Type 2 diabetes mellitus without complications: Secondary | ICD-10-CM | POA: Insufficient documentation

## 2023-05-21 DIAGNOSIS — Z7984 Long term (current) use of oral hypoglycemic drugs: Secondary | ICD-10-CM | POA: Diagnosis not present

## 2023-05-21 DIAGNOSIS — M5432 Sciatica, left side: Secondary | ICD-10-CM | POA: Insufficient documentation

## 2023-05-21 LAB — URINALYSIS, ROUTINE W REFLEX MICROSCOPIC
Bilirubin Urine: NEGATIVE
Glucose, UA: NEGATIVE mg/dL
Ketones, ur: NEGATIVE mg/dL
Leukocytes,Ua: NEGATIVE
Nitrite: NEGATIVE
Protein, ur: NEGATIVE mg/dL
Specific Gravity, Urine: 1.025 (ref 1.005–1.030)
pH: 6 (ref 5.0–8.0)

## 2023-05-21 LAB — URINALYSIS, MICROSCOPIC (REFLEX)

## 2023-05-21 MED ORDER — KETOROLAC TROMETHAMINE 30 MG/ML IJ SOLN
30.0000 mg | Freq: Once | INTRAMUSCULAR | Status: AC
  Administered 2023-05-21: 30 mg via INTRAMUSCULAR
  Filled 2023-05-21: qty 1

## 2023-05-21 NOTE — Discharge Instructions (Addendum)
You are seen in the emergency department for low back pain.  Your urine shows that there may still be a possible UTI but given that you are not having symptoms, I do not believe that you benefit from starting a new antibiotic at this point.  Your x-ray was also enlarged unremarkable at this note some arthritis in the low back.  You are given a dose of Toradol here in the emergency department for improvement of your symptoms.  Please take a high-dose ibuprofen or naproxen at home beginning tomorrow morning for the next 2 weeks.  If you develop any groin numbness, bowel or bladder incontinence, or severe weakness and numbness on one leg, please return the emergency department for further evaluation.

## 2023-05-21 NOTE — ED Provider Notes (Signed)
[place] [facility] Provider Note   CSN: [id number] Arrival date & time: 05/21/23  1020     History Chief Complaint  Patient presents with   Back Pain    Allison Poole is a 52 y.o. female.  Patient presents the emergency department with concerns of back pain.  Past medical history significant for type 2 diabetes, severe obesity, and hypertension.  Was recently treated for a UTI about 3 weeks ago and feels that her symptoms have since resolved.  Back pain has been ongoing for the last few days without improvement with over-the-counter medications.  Is that she may have strained her back more earlier this morning when she was picking something up.  No recent injury or trauma or falls to account for any of patient's symptoms.  No prior history of spinal surgeries.  Denies saddle paresthesia, bowel or bladder incontinence, unilateral weakness or numbness.  Denies any chance of pregnancy at this time.   Back Pain      Home Medications Prior to Admission medications   Medication Sig Start Date End Date Taking? Authorizing Provider  acetaminophen (TYLENOL) 500 MG tablet Take 500 mg by mouth every 6 (six) hours as needed for moderate pain.    [provider]  amLODipine (NORVASC) 5 MG tablet Take 1 tablet (5 mg total) by mouth daily. 02/28/23 03/30/23  Alvira Monday, MD  BD PEN NEEDLE NANO 2ND GEN 32G X 4 MM MISC See admin instructions. 01/12/22   [provider]  blood glucose meter kit and supplies Dispense based on patient and insurance preference. Use up to four times daily as directed. (FOR ICD-9 250.00, 250.01). 01/12/22   [name], MD  Dulaglutide (TRULICITY) 1.5 MG/0.5ML SOPN Inject 1.5 mg into the skin once a week. 07/28/22   Georganna Skeans, MD  ferrous sulfate 325 (65 FE) MG tablet Take 1 tablet (325 mg total) by mouth daily. 02/28/23   Alvira Monday, MD  fluticasone (FLONASE) 50 MCG/ACT nasal spray Place 1 spray into both  nostrils daily as needed for allergies or rhinitis.    [provider]  hydrOXYzine (ATARAX) 10 MG tablet Take 1 tablet (10 mg total) by mouth 3 (three) times daily as needed. 01/23/22   Mayers, Cari S, PA-C  loratadine (CLARITIN) 10 MG tablet Take 10 mg by mouth daily as needed for allergies.    [provider]  metFORMIN (GLUCOPHAGE-XR) 500 MG 24 hr tablet Take 2 tablets (1,000 mg total) by mouth 2 (two) times daily. 02/28/23   Alvira Monday, MD  rosuvastatin (CRESTOR) 5 MG tablet Take 1 tablet (5 mg total) by mouth daily. 02/28/23   Alvira Monday, MD  triamcinolone cream (KENALOG) 0.1 % Apply 1 application. topically 2 (two) times daily. 01/23/22   Mayers, Cari S, PA-C      Allergies    Patient has no known allergies.    Review of Systems   Review of Systems  Musculoskeletal:  Positive for back pain.  All other systems reviewed and are negative.   Physical Exam Updated Vital Signs BP (!) 161/93   Pulse 65   Temp 98 F (36.7 C) (Oral)   Resp 16   Wt 108.9 kg   LMP 05/05/2023 (Exact Date) Comment: also had tubal ligation, per patient no chance of preg.  SpO2 99%   BMI 42.51 kg/m  Physical Exam Vitals and nursing note reviewed.  Constitutional:      General: She is not in acute  distress.    Appearance: She is well-developed.  HENT:     Head: Normocephalic and atraumatic.  Eyes:     Conjunctiva/sclera: Conjunctivae normal.  Cardiovascular:     Rate and Rhythm: Normal rate and regular rhythm.     Heart sounds: No murmur heard. Pulmonary:     Effort: Pulmonary effort is normal. No respiratory distress.     Breath sounds: Normal breath sounds.  Abdominal:     Palpations: Abdomen is soft.     Tenderness: There is no abdominal tenderness.  Musculoskeletal:        General: Tenderness present. No swelling, deformity or signs of injury.     Cervical back: Neck supple.     Comments: Positive straight leg raise on left side. Right side unremarkable.   Skin:    General: Skin is warm and dry.     Capillary Refill: Capillary refill takes less than 2 seconds.  Neurological:     General: No focal deficit present.     Mental Status: She is alert.     Deep Tendon Reflexes: Reflexes normal.  Psychiatric:        Mood and Affect: Mood normal.     ED Results / Procedures / Treatments   Labs (all labs ordered are listed, but only abnormal results are displayed) Labs Reviewed  URINALYSIS, ROUTINE W REFLEX MICROSCOPIC - Abnormal; Notable for the following components:      Result Value   Hgb urine dipstick TRACE (*)    All other components within normal limits  URINALYSIS, MICROSCOPIC (REFLEX) - Abnormal; Notable for the following components:   Bacteria, UA RARE (*)    All other components within normal limits    EKG None  Radiology DG Lumbar Spine 2-3 Views  Result Date: 05/21/2023 CLINICAL DATA:  Low back pain EXAM: LUMBAR SPINE - 2 VIEW COMPARISON:  None Available. FINDINGS: Five lumbar-type vertebral bodies. Grade 1 anterolisthesis of L4 on L5. Moderate to severe disc space loss L5-S1. Moderate to severe facet degenerative change at L4-L5 and L5-S1. Vertebral body heights are maintained. Evaluation of the sacrum is limited to overlying bowel gas. Surgical clips in the quadrant. Nonobstructive bowel gas pattern. IMPRESSION: No acute osseous abnormality. Lower lumbar spine predominant degenerative changes. Electronically Signed   By: Lorenza Cambridge M.D.   On: 05/21/2023 12:45    Procedures Procedures   Medications Ordered in ED Medications  ketorolac (TORADOL) 30 MG/ML injection 30 mg (30 mg Intramuscular Given 05/21/23 1409)    ED Course/ Medical Decision Making/ A&P                               Medical Decision Making Amount and/or Complexity of Data Reviewed Labs: ordered. Radiology: ordered.  Risk Prescription drug management.   This patient presents to the ED for concern of low back pain.  Differential diagnosis  includes lumbar radiculopathy, spinal abscess, cauda equina syndrome, UTI, pyelonephritis   Lab Tests:  I Ordered, and personally interpreted labs.  The pertinent results include: UA with possible signs of infection with trace blood and rare bacteria   Imaging Studies ordered:  I ordered imaging studies including x-ray lumbar spine I independently visualized and interpreted imaging which showed no acute abnormality, degenerative changes in the lumbar spine I agree with the radiologist interpretation   Medicines ordered and prescription drug management:  I ordered medication including Toradol for pain Reevaluation of the patient after these medicines showed  that the patient improved I have reviewed the patients home medicines and have made adjustments as needed   Problem List / ED Course:  Patient presents to the emergency department complaints of low back pain start about 2 days ago.  Reports that the pain radiates into the left upper leg but denies any weakness or numbness in this leg.  Denies any bowel or bladder incontinence or saddle paresthesia.  Low concern for cauda equina syndrome at this time.  Given lack of prior history of similar symptoms or recent injury to this area, will repeat UA for evaluation of resolution of UTI and also perform x-ray imaging of the lumbar spine.  Patient denies any renal history or any chance of pregnancy at this time. Patient is negative on the UA for obvious signs of infection and particular given lack of symptoms, will treat as if this is sciatica.  Lumbar spine x-ray is also reassuring without any acute findings but does note some degenerative changes.  Encourage patient to resume perform gentle stretching and be intentional with any weight lifting from below waist level.  Encourage patient to continue taking high-dose ibuprofen or Aleve at home for the next 2 weeks.  Patient is agreeable to treatment plan verbalized understanding all return  precautions.  All questions answered prior to patient discharge.  Final Clinical Impression(s) / ED Diagnoses Final diagnoses:  Sciatica of left side    Rx / DC Orders ED Discharge Orders     None         Smitty Knudsen, PA-C 05/21/23 1414    Rolan Bucco, MD 05/21/23 1430

## 2023-05-21 NOTE — ED Triage Notes (Signed)
Lower back started 2 days ago , radiates to abdomen. No Hx of kidney issues , no urinary symptoms

## 2023-12-16 ENCOUNTER — Other Ambulatory Visit: Payer: Self-pay

## 2023-12-16 ENCOUNTER — Emergency Department (HOSPITAL_BASED_OUTPATIENT_CLINIC_OR_DEPARTMENT_OTHER)
Admission: EM | Admit: 2023-12-16 | Discharge: 2023-12-16 | Disposition: A | Discharge status: Home / Self Care | Payer: Self-pay | Attending: Emergency Medicine | Admitting: Emergency Medicine

## 2023-12-16 ENCOUNTER — Encounter (HOSPITAL_BASED_OUTPATIENT_CLINIC_OR_DEPARTMENT_OTHER): Payer: Self-pay

## 2023-12-16 DIAGNOSIS — J019 Acute sinusitis, unspecified: Secondary | ICD-10-CM | POA: Insufficient documentation

## 2023-12-16 LAB — RESP PANEL BY RT-PCR (RSV, FLU A&B, COVID)  RVPGX2
Influenza A by PCR: NEGATIVE
Influenza B by PCR: NEGATIVE
Resp Syncytial Virus by PCR: NEGATIVE
SARS Coronavirus 2 by RT PCR: NEGATIVE

## 2023-12-16 MED ORDER — AMOXICILLIN-POT CLAVULANATE 875-125 MG PO TABS: 1.0000 | ORAL_TABLET | Freq: Two times a day (BID) | ORAL | 0 refills | Status: DC

## 2023-12-16 MED ORDER — AMOXICILLIN-POT CLAVULANATE 875-125 MG PO TABS: 1.0000 | ORAL_TABLET | Freq: Two times a day (BID) | ORAL | 0 refills | Status: AC

## 2023-12-16 MED ORDER — AMOXICILLIN-POT CLAVULANATE 875-125 MG PO TABS
1.0000 | ORAL_TABLET | Freq: Two times a day (BID) | ORAL | 0 refills | Status: DC
Start: 2023-12-16 — End: 2023-12-16

## 2023-12-16 NOTE — ED Triage Notes (Signed)
 Pt states she believes she had a sinus infection. Pt reports having congestion x 1-2 weeks, cough x 1 day. Denies SOB, fever, weakness, fatigue. NAD noted in triage.

## 2023-12-16 NOTE — Discharge Instructions (Signed)
 Please read and follow all provided instructions.  Your diagnoses today include:  1. Acute non-recurrent sinusitis, unspecified location    Tests performed today include: Flu, COVID, RSV testing: Negative Vital signs. See below for your results today.   Medications prescribed:  Augmentin - antibiotic  You have been prescribed an antibiotic medicine: take the entire course of medicine even if you are feeling better. Stopping early can cause the antibiotic not to work.  Take any prescribed medications only as directed.  Home care instructions:  Follow any educational materials contained in this packet.  BE VERY CAREFUL not to take multiple medicines containing Tylenol (also called acetaminophen). Doing so can lead to an overdose which can damage your liver and cause liver failure and possibly death.   Follow-up instructions: Please follow-up with your primary care provider in the next 5 days for further evaluation of your symptoms if not getting better.  Return instructions:  Please return to the Emergency Department if you experience worsening symptoms.  Please return if you have any other emergent concerns.  Additional Information:  Your vital signs today were: BP (!) 145/75 (BP Location: Left Arm)   Pulse 64   Temp 98.1 F (36.7 C) (Oral)   Resp 18   Ht 5\' 3"  (1.6 m)   Wt 106.6 kg   SpO2 99%   BMI 41.63 kg/m  If your blood pressure (BP) was elevated above 135/85 this visit, please have this repeated by your doctor within one month. --------------

## 2023-12-16 NOTE — ED Provider Notes (Signed)
 [place] [facility] Provider Note   CSN: [id number] Arrival date & time: 12/16/23  1625     History  Chief Complaint  Patient presents with   Cough   Nasal Congestion    Allison Poole is a 53 y.o. female.  Patient with history of diabetes presents to the emergency department for evaluation of sinus pain and pressure, nasal congestion x 1 week.  No fevers.  She developed a cough yesterday.  No shortness of breath.  No nausea, vomiting, diarrhea.  She has been using Mucinex and nasal rinses without improvement.  She states that she is prone to sinus infection and last year had symptoms for about a month.       Home Medications Prior to Admission medications   Medication Sig Start Date End Date Taking? Authorizing Provider  amoxicillin-clavulanate (AUGMENTIN) 875-125 MG tablet Take 1 tablet by mouth every 12 (twelve) hours. 12/16/23  Yes Renne Crigler, PA-C  acetaminophen (TYLENOL) 500 MG tablet Take 500 mg by mouth every 6 (six) hours as needed for moderate pain.    [provider]  amLODipine (NORVASC) 5 MG tablet Take 1 tablet (5 mg total) by mouth daily. 02/28/23 03/30/23  Alvira Monday, MD  BD PEN NEEDLE NANO 2ND GEN 32G X 4 MM MISC See admin instructions. 01/12/22   [provider]  blood glucose meter kit and supplies Dispense based on patient and insurance preference. Use up to four times daily as directed. (FOR ICD-9 250.00, 250.01). 01/12/22   [name], MD  Dulaglutide (TRULICITY) 1.5 MG/0.5ML SOPN Inject 1.5 mg into the skin once a week. 07/28/22   Georganna Skeans, MD  ferrous sulfate 325 (65 FE) MG tablet Take 1 tablet (325 mg total) by mouth daily. 02/28/23   Alvira Monday, MD  fluticasone (FLONASE) 50 MCG/ACT nasal spray Place 1 spray into both nostrils daily as needed for allergies or rhinitis.    [provider]  hydrOXYzine (ATARAX) 10 MG tablet Take 1 tablet (10 mg total) by mouth 3 (three) times  daily as needed. 01/23/22   Mayers, Cari S, PA-C  loratadine (CLARITIN) 10 MG tablet Take 10 mg by mouth daily as needed for allergies.    [provider]  metFORMIN (GLUCOPHAGE-XR) 500 MG 24 hr tablet Take 2 tablets (1,000 mg total) by mouth 2 (two) times daily. 02/28/23   Alvira Monday, MD  rosuvastatin (CRESTOR) 5 MG tablet Take 1 tablet (5 mg total) by mouth daily. 02/28/23   Alvira Monday, MD  triamcinolone cream (KENALOG) 0.1 % Apply 1 application. topically 2 (two) times daily. 01/23/22   Mayers, Cari S, PA-C      Allergies    Patient has no known allergies.    Review of Systems   Review of Systems  Physical Exam Updated Vital Signs BP (!) 145/75 (BP Location: Left Arm)   Pulse 64   Temp 98.1 F (36.7 C) (Oral)   Resp 18   Ht 5\' 3"  (1.6 m)   Wt 106.6 kg   SpO2 99%   BMI 41.63 kg/m  Physical Exam Vitals and nursing note reviewed.  Constitutional:      Appearance: She is well-developed.  HENT:     Head: Normocephalic and atraumatic.     Jaw: No trismus.     Right Ear: Tympanic membrane, ear canal and external ear normal.     Left Ear: Tympanic membrane, ear canal and external ear normal.     Nose:  Congestion and rhinorrhea present. No mucosal edema.     Right Turbinates: Swollen.     Left Turbinates: Swollen.     Right Sinus: Maxillary sinus tenderness and frontal sinus tenderness present.     Left Sinus: Maxillary sinus tenderness and frontal sinus tenderness present.     Mouth/Throat:     Mouth: Mucous membranes are moist. Mucous membranes are not dry. No oral lesions.     Pharynx: Uvula midline. No oropharyngeal exudate, posterior oropharyngeal erythema or uvula swelling.     Tonsils: No tonsillar abscesses.  Eyes:     General:        Right eye: No discharge.        Left eye: No discharge.     Conjunctiva/sclera: Conjunctivae normal.  Cardiovascular:     Rate and Rhythm: Normal rate and regular rhythm.     Heart sounds: Normal heart sounds.   Pulmonary:     Effort: Pulmonary effort is normal. No respiratory distress.     Breath sounds: Normal breath sounds. No wheezing or rales.  Abdominal:     Palpations: Abdomen is soft.     Tenderness: There is no abdominal tenderness.  Musculoskeletal:     Cervical back: Normal range of motion and neck supple.  Lymphadenopathy:     Cervical: No cervical adenopathy.  Skin:    General: Skin is warm and dry.  Neurological:     Mental Status: She is alert.  Psychiatric:        Mood and Affect: Mood normal.     ED Results / Procedures / Treatments   Labs (all labs ordered are listed, but only abnormal results are displayed) Labs Reviewed  RESP PANEL BY RT-PCR (RSV, FLU A&B, COVID)  [id number]    EKG None  Radiology No results found.  Procedures Procedures    Medications Ordered in ED Medications - No data to display  ED Course/ Medical Decision Making/ A&P    Patient seen and examined. History obtained directly from patient. Work-up including labs, imaging, EKG ordered in triage, if performed, were reviewed.    Labs/EKG: Independently reviewed and interpreted.  This included: Viral panel negative  Imaging: None ordered  Medications/Fluids: None ordered  Most recent vital signs reviewed and are as follows: BP (!) 145/75 (BP Location: Left Arm)   Pulse 64   Temp 98.1 F (36.7 C) (Oral)   Resp 18   Ht 5\' 3"  (1.6 m)   Wt 106.6 kg   SpO2 99%   BMI 41.63 kg/m   Initial impression: Acute sinusitis  Home treatment plan: Continue over-the-counter medications such as decongestants or antihistamines, prescription for Augmentin, encouraged use of probiotics  Return instructions discussed with patient: New or worsening symptoms  Follow-up instructions discussed with patient: PCP in 5 days if not improving                                Medical Decision Making Risk Prescription drug management.   Well-appearing patient with nasal congestion x 1 week.  Will trial  Augmentin.  Patient using over-the-counter medication with minimal improvement.  She looks well, nontoxic.  Do not suspect any diabetic related emergencies at this time.        Final Clinical Impression(s) / ED Diagnoses Final diagnoses:  Acute non-recurrent sinusitis, unspecified location    Rx / DC Orders ED Discharge Orders          Ordered  amoxicillin-clavulanate (AUGMENTIN) 875-125 MG tablet  Every 12 hours        12/16/23 1807              Renne Crigler, PA-C 12/16/23 1810    Glyn Ade, MD 12/16/23 2239
